# Patient Record
Sex: Male | Born: 1950 | Race: White | Hispanic: No | Marital: Single | Smoking: Never smoker
Health system: Southern US, Community
[De-identification: ages and names within clinical notes are randomized; demographics above are authoritative.]

## PROBLEM LIST (undated history)

## (undated) DIAGNOSIS — I10 Essential (primary) hypertension: Secondary | ICD-10-CM

## (undated) DIAGNOSIS — I4891 Unspecified atrial fibrillation: Secondary | ICD-10-CM

## (undated) DIAGNOSIS — E119 Type 2 diabetes mellitus without complications: Secondary | ICD-10-CM

---

## 2012-03-11 ENCOUNTER — Observation Stay (HOSPITAL_BASED_OUTPATIENT_CLINIC_OR_DEPARTMENT_OTHER)
Admission: EM | Admit: 2012-03-11 | Discharge: 2012-03-12 | Disposition: A | Discharge status: Home / Self Care | Payer: Self-pay | Attending: Internal Medicine | Admitting: Internal Medicine

## 2012-03-11 ENCOUNTER — Encounter (HOSPITAL_BASED_OUTPATIENT_CLINIC_OR_DEPARTMENT_OTHER): Payer: Self-pay | Admitting: Family Medicine

## 2012-03-11 ENCOUNTER — Emergency Department (HOSPITAL_BASED_OUTPATIENT_CLINIC_OR_DEPARTMENT_OTHER): Payer: Self-pay

## 2012-03-11 DIAGNOSIS — I10 Essential (primary) hypertension: Secondary | ICD-10-CM | POA: Diagnosis present

## 2012-03-11 DIAGNOSIS — R110 Nausea: Secondary | ICD-10-CM | POA: Insufficient documentation

## 2012-03-11 DIAGNOSIS — R079 Chest pain, unspecified: Principal | ICD-10-CM | POA: Diagnosis present

## 2012-03-11 DIAGNOSIS — E118 Type 2 diabetes mellitus with unspecified complications: Secondary | ICD-10-CM | POA: Diagnosis present

## 2012-03-11 DIAGNOSIS — IMO0002 Reserved for concepts with insufficient information to code with codable children: Secondary | ICD-10-CM | POA: Diagnosis present

## 2012-03-11 DIAGNOSIS — E785 Hyperlipidemia, unspecified: Secondary | ICD-10-CM | POA: Diagnosis present

## 2012-03-11 DIAGNOSIS — F1010 Alcohol abuse, uncomplicated: Secondary | ICD-10-CM | POA: Diagnosis present

## 2012-03-11 DIAGNOSIS — E1165 Type 2 diabetes mellitus with hyperglycemia: Secondary | ICD-10-CM | POA: Diagnosis present

## 2012-03-11 DIAGNOSIS — R0602 Shortness of breath: Secondary | ICD-10-CM | POA: Insufficient documentation

## 2012-03-11 DIAGNOSIS — R42 Dizziness and giddiness: Secondary | ICD-10-CM | POA: Insufficient documentation

## 2012-03-11 DIAGNOSIS — I4891 Unspecified atrial fibrillation: Secondary | ICD-10-CM | POA: Diagnosis present

## 2012-03-11 HISTORY — DX: Type 2 diabetes mellitus without complications: E11.9

## 2012-03-11 HISTORY — DX: Essential (primary) hypertension: I10

## 2012-03-11 LAB — COMPREHENSIVE METABOLIC PANEL
ALT: 44 U/L (ref 0–53)
CO2: 28 mEq/L (ref 19–32)
Calcium: 10 mg/dL (ref 8.4–10.5)
Creatinine, Ser: 0.8 mg/dL (ref 0.50–1.35)
GFR calc Af Amer: >90 mL/min (ref >90)
GFR calc non Af Amer: >90 mL/min (ref >90)
Glucose, Bld: 236 mg/dL — ABNORMAL HIGH (ref 70–99)

## 2012-03-11 LAB — CBC WITH DIFFERENTIAL/PLATELET
Eosinophils Relative: 1 % (ref 0–5)
HCT: 40.9 % (ref 39.0–52.0)
Lymphocytes Relative: 20 % (ref 12–46)
Lymphs Abs: 1.5 10*3/uL (ref 0.7–4.0)
MCV: 91.9 fL (ref 78.0–100.0)
Monocytes Absolute: 0.7 10*3/uL (ref 0.1–1.0)
Monocytes Relative: 8 % (ref 3–12)
RBC: 4.45 MIL/uL (ref 4.22–5.81)
WBC: 8.9 10*3/uL (ref 4.0–10.5)

## 2012-03-11 LAB — HEPARIN LEVEL (UNFRACTIONATED): Heparin Unfractionated: 0.32 IU/mL (ref 0.30–0.70)

## 2012-03-11 LAB — RAPID URINE DRUG SCREEN, HOSP PERFORMED
Amphetamines: NOT DETECTED
Benzodiazepines: NOT DETECTED
Opiates: NOT DETECTED
Tetrahydrocannabinol: NOT DETECTED

## 2012-03-11 LAB — URINALYSIS, ROUTINE W REFLEX MICROSCOPIC
Hgb urine dipstick: NEGATIVE
Protein, ur: NEGATIVE mg/dL
Urobilinogen, UA: 0.2 mg/dL (ref 0.0–1.0)

## 2012-03-11 LAB — GLUCOSE, CAPILLARY: Glucose-Capillary: 217 mg/dL — ABNORMAL HIGH (ref 70–99)

## 2012-03-11 MED ORDER — LORAZEPAM 1 MG PO TABS: 1.0000 mg | ORAL_TABLET | Freq: Four times a day (QID) | ORAL | Status: DC | PRN

## 2012-03-11 MED ORDER — ONDANSETRON HCL 4 MG/2ML IJ SOLN
INTRAMUSCULAR | Status: AC
  Administered 2012-03-11: 16:00:00
  Filled 2012-03-11: qty 2

## 2012-03-11 MED ORDER — SODIUM CHLORIDE 0.9 % IV SOLN: INTRAVENOUS | Status: DC

## 2012-03-11 MED ORDER — HYDRALAZINE HCL 20 MG/ML IJ SOLN: 10.0000 mg | Freq: Four times a day (QID) | INTRAMUSCULAR | Status: DC | PRN

## 2012-03-11 MED ORDER — ENOXAPARIN SODIUM 40 MG/0.4ML ~~LOC~~ SOLN
40.0000 mg | SUBCUTANEOUS | Status: DC
  Filled 2012-03-11: qty 0.4

## 2012-03-11 MED ORDER — ONDANSETRON HCL 4 MG PO TABS: 4.0000 mg | ORAL_TABLET | Freq: Four times a day (QID) | ORAL | Status: DC | PRN

## 2012-03-11 MED ORDER — NITROGLYCERIN IN D5W 200-5 MCG/ML-% IV SOLN
5.0000 ug/min | INTRAVENOUS | Status: DC
  Administered 2012-03-11: 5 ug/min via INTRAVENOUS
  Filled 2012-03-11: qty 250

## 2012-03-11 MED ORDER — LORAZEPAM 2 MG/ML IJ SOLN: 1.0000 mg | Freq: Four times a day (QID) | INTRAMUSCULAR | Status: DC | PRN

## 2012-03-11 MED ORDER — NITROGLYCERIN 0.4 MG SL SUBL
0.4000 mg | SUBLINGUAL_TABLET | SUBLINGUAL | Status: AC | PRN
  Administered 2012-03-11 (×3): 0.4 mg via SUBLINGUAL
  Filled 2012-03-11: qty 25

## 2012-03-11 MED ORDER — FOLIC ACID 1 MG PO TABS
1.0000 mg | ORAL_TABLET | Freq: Every day | ORAL | Status: DC
  Administered 2012-03-12: 1 mg via ORAL
  Filled 2012-03-11: qty 1

## 2012-03-11 MED ORDER — CARVEDILOL 6.25 MG PO TABS
6.2500 mg | ORAL_TABLET | Freq: Two times a day (BID) | ORAL | Status: DC
  Administered 2012-03-11 – 2012-03-12 (×2): 6.25 mg via ORAL
  Filled 2012-03-11 (×4): qty 1

## 2012-03-11 MED ORDER — THIAMINE HCL 100 MG/ML IJ SOLN
100.0000 mg | Freq: Every day | INTRAMUSCULAR | Status: DC
  Filled 2012-03-11: qty 1

## 2012-03-11 MED ORDER — INSULIN GLARGINE 100 UNIT/ML ~~LOC~~ SOLN
10.0000 [IU] | Freq: Every day | SUBCUTANEOUS | Status: DC
  Administered 2012-03-11: 10 [IU] via SUBCUTANEOUS

## 2012-03-11 MED ORDER — MORPHINE SULFATE 2 MG/ML IJ SOLN
1.0000 mg | INTRAMUSCULAR | Status: DC | PRN
  Administered 2012-03-11: 2 mg via INTRAVENOUS
  Filled 2012-03-11: qty 1

## 2012-03-11 MED ORDER — VITAMIN B-1 100 MG PO TABS
100.0000 mg | ORAL_TABLET | Freq: Every day | ORAL | Status: DC
  Administered 2012-03-12: 100 mg via ORAL
  Filled 2012-03-11: qty 1

## 2012-03-11 MED ORDER — INSULIN ASPART 100 UNIT/ML ~~LOC~~ SOLN
0.0000 [IU] | Freq: Three times a day (TID) | SUBCUTANEOUS | Status: DC
  Administered 2012-03-11 – 2012-03-12 (×2): 2 [IU] via SUBCUTANEOUS

## 2012-03-11 MED ORDER — HEPARIN BOLUS VIA INFUSION
4000.0000 [IU] | Freq: Once | INTRAVENOUS | Status: AC
  Administered 2012-03-11: 4000 [IU] via INTRAVENOUS
  Filled 2012-03-11: qty 4000

## 2012-03-11 MED ORDER — ADULT MULTIVITAMIN W/MINERALS CH
1.0000 | ORAL_TABLET | Freq: Every day | ORAL | Status: DC
  Administered 2012-03-12: 1 via ORAL
  Filled 2012-03-11: qty 1

## 2012-03-11 MED ORDER — LISINOPRIL 5 MG PO TABS
5.0000 mg | ORAL_TABLET | Freq: Every day | ORAL | Status: DC
  Administered 2012-03-11 – 2012-03-12 (×2): 5 mg via ORAL
  Filled 2012-03-11 (×2): qty 1

## 2012-03-11 MED ORDER — HEPARIN (PORCINE) IN NACL 100-0.45 UNIT/ML-% IJ SOLN
1600.0000 [IU]/h | INTRAMUSCULAR | Status: DC
  Administered 2012-03-11: 1400 [IU]/h via INTRAVENOUS
  Administered 2012-03-12: 1600 [IU]/h via INTRAVENOUS
  Filled 2012-03-11 (×3): qty 250

## 2012-03-11 MED ORDER — REGADENOSON 0.4 MG/5ML IV SOLN
0.4000 mg | Freq: Once | INTRAVENOUS | Status: DC
  Filled 2012-03-11: qty 5

## 2012-03-11 MED ORDER — INSULIN ASPART 100 UNIT/ML ~~LOC~~ SOLN: 0.0000 [IU] | Freq: Every day | SUBCUTANEOUS | Status: DC

## 2012-03-11 MED ORDER — ONDANSETRON HCL 4 MG/2ML IJ SOLN: 4.0000 mg | Freq: Four times a day (QID) | INTRAMUSCULAR | Status: DC | PRN

## 2012-03-11 NOTE — Progress Notes (Signed)
Utilization Review Completed.Delois Tolbert T11/27/2013   

## 2012-03-11 NOTE — ED Notes (Signed)
Patient transported to X-ray monitored by this rn.

## 2012-03-11 NOTE — ED Notes (Signed)
Pt c/o chest pain and "fluttering" in chest since yesterday with nausea, dizziness.

## 2012-03-11 NOTE — Progress Notes (Signed)
  Echocardiogram 2D Echocardiogram has been performed.  Georgian Co 03/11/2012, 4:40 PM

## 2012-03-11 NOTE — ED Provider Notes (Addendum)
History     CSN: [id number]  Arrival date & time 03/11/12  4098   First MD Initiated Contact with Patient 03/11/12 [phone]      Chief Complaint  Patient presents with  . Chest Pain    (Consider location/radiation/quality/duration/timing/severity/associated sxs/prior treatment) HPI Comments: Patient complains of chest pain that started yesterday and has been constant however has been waxing and waning since then. He describes it as a tightness to the left chest which is nonradiating. He's had some shortness of breath and nausea associated with it. He's had some intermittent fluttering in his chest with associated dizziness. He denies any swelling in his legs. Denies any cough or chest congestion. He denies any diaphoresis. He currently does not have any primary care physician. He states he's had a history of atrial fibrillation but has not been treated for it. He currently is receiving no ongoing medical care. He has noted in the recent past his blood pressures been elevated and recently that his blood sugars have been elevated into the 300 range. He states that over last 2 weeks he's cut out all carbs in his diet. He does report frequent alcohol use. He denies any history of past heart problems other than the irregular heartbeat. He denies having a stress test in the past.  Patient is a 61 y.o. male presenting with chest pain.  Chest Pain Primary symptoms include shortness of breath, palpitations, nausea and dizziness. Pertinent negatives for primary symptoms include no fever, no fatigue, no cough, no abdominal pain and no vomiting.  The palpitations also occurred with dizziness and shortness of breath.   Dizziness also occurs with nausea. Dizziness does not occur with vomiting, weakness or diaphoresis.  Pertinent negatives for associated symptoms include no diaphoresis, no numbness and no weakness.     Past Medical History  Diagnosis Date  . Hypertension   . Diabetes mellitus without  complication     History reviewed. No pertinent past surgical history.  No family history on file.  History  Substance Use Topics  . Smoking status: Never Smoker   . Smokeless tobacco: Not on file  . Alcohol Use: Yes      Review of Systems  Constitutional: Negative for fever, chills, diaphoresis and fatigue.  HENT: Negative for congestion, rhinorrhea and sneezing.   Eyes: Negative.   Respiratory: Positive for shortness of breath. Negative for cough and chest tightness.   Cardiovascular: Positive for chest pain and palpitations. Negative for leg swelling.  Gastrointestinal: Positive for nausea. Negative for vomiting, abdominal pain, diarrhea and blood in stool.  Genitourinary: Negative for frequency, hematuria, flank pain and difficulty urinating.  Musculoskeletal: Negative for back pain and arthralgias.  Skin: Negative for rash.  Neurological: Positive for dizziness. Negative for speech difficulty, weakness, numbness and headaches.    Allergies  Aspirin and Nsaids  Home Medications  No current outpatient prescriptions on file.  BP 167/132  Pulse 98  Temp 98 F (36.7 C) (Oral)  Resp 18  Ht 6' (1.829 m)  Wt 241 lb (109.317 kg)  BMI 32.69 kg/m2  SpO2 100%  Physical Exam  Constitutional: He is oriented to person, place, and time. He appears well-developed and well-nourished.  HENT:  Head: Normocephalic and atraumatic.  Eyes: Pupils are equal, round, and reactive to light.  Neck: Normal range of motion. Neck supple.       He has some fullness noted to his bilateral supraclavicular area. No definitive masses palpated.  Cardiovascular: Normal rate and normal heart sounds.  An irregularly irregular rhythm present.  Pulmonary/Chest: Effort normal and breath sounds normal. No respiratory distress. He has no wheezes. He has no rales. He exhibits no tenderness.  Abdominal: Soft. Bowel sounds are normal. There is no tenderness. There is no rebound and no guarding.    Musculoskeletal: Normal range of motion. He exhibits no edema.  Lymphadenopathy:    He has no cervical adenopathy.  Neurological: He is alert and oriented to person, place, and time.  Skin: Skin is warm and dry. No rash noted.  Psychiatric: He has a normal mood and affect.    ED Course  Procedures (including critical care time)  Results for orders placed during the hospital encounter of 03/11/12  CBC WITH DIFFERENTIAL      Component Value Range   WBC 8.9  4.0 - 10.5 K/uL   RBC 4.45  4.22 - 5.81 MIL/uL   Hemoglobin 15.0  13.0 - 17.0 g/dL   HCT 16.1  09.6 - 04.5 %   MCV 91.9  78.0 - 100.0 fL   MCH 33.7  26.0 - 34.0 pg   MCHC 36.7 (*) 30.0 - 36.0 g/dL   RDW 40.9  81.1 - 91.4 %   Platelets 151  150 - 400 K/uL   Neutrophils Relative 71  43 - 77 %   Neutro Abs 5.5  1.7 - 7.7 K/uL   Lymphocytes Relative 20  12 - 46 %   Lymphs Abs 1.5  0.7 - 4.0 K/uL   Monocytes Relative 8  3 - 12 %   Monocytes Absolute 0.7  0.1 - 1.0 K/uL   Eosinophils Relative 1  0 - 5 %   Eosinophils Absolute 0.1  0.0 - 0.7 K/uL   Basophils Relative 0  0 - 1 %   Basophils Absolute 0.0  0.0 - 0.1 K/uL  COMPREHENSIVE METABOLIC PANEL      Component Value Range   Sodium 136  135 - 145 mEq/L   Potassium 4.1  3.5 - 5.1 mEq/L   Chloride 95 (*) 96 - 112 mEq/L   CO2 28  19 - 32 mEq/L   Glucose, Bld 236 (*) 70 - 99 mg/dL   BUN 13  6 - 23 mg/dL   Creatinine, Ser 7.82  0.50 - 1.35 mg/dL   Calcium 95.6  8.4 - 21.3 mg/dL   Total Protein 8.2  6.0 - 8.3 g/dL   Albumin 4.7  3.5 - 5.2 g/dL   AST 38 (*) 0 - 37 U/L   ALT 44  0 - 53 U/L   Alkaline Phosphatase 71  39 - 117 U/L   Total Bilirubin 0.7  0.3 - 1.2 mg/dL   GFR calc non Af Amer >90  >90 mL/min   GFR calc Af Amer >90  >90 mL/min  TROPONIN I      Component Value Range   Troponin I <0.30  <0.30 ng/mL  GLUCOSE, CAPILLARY      Component Value Range   Glucose-Capillary 217 (*) 70 - 99 mg/dL  ETHANOL      Component Value Range   Alcohol, Ethyl (B) <11  0 - 11  mg/dL   Dg Chest 2 View  08/65/7846  *RADIOLOGY REPORT*  Clinical Data: Chest pain.  Flaring of the chest.  Nausea and dizziness.  History of diabetes and hypertension.  CHEST - 2 VIEW  Comparison: None.  Findings:  Cardiopericardial silhouette within normal limits. Mediastinal contours normal. Trachea midline.  No airspace disease or effusion. Monitoring leads are  projected over the chest.  Mild wedging of lower thoracic vertebral bodies is probably chronic.  IMPRESSION: No active cardiopulmonary disease.   Original Report Authenticated By: Andreas Newport, M.D.     Date: 03/11/2012  Rate: 96  Rhythm: atrial fibrillation  QRS Axis: normal  Intervals: normal  ST/T Wave abnormalities: nonspecific ST/T changes  Conduction Disutrbances:none  Narrative Interpretation:   Old EKG Reviewed: none available     1. Chest pain       MDM  Pt virtually pain free after nitro, still mild discomfort.  With his uncontrolled HTN, DM, a-fib, I feel that he needs to be admitted for further evaluation.  Will consult hospitalist at [facility].  Pt was not given asa as he says that he has anaphylaxis to that.        Rolan Bucco, MD 03/11/12 1134  Rolan Bucco, MD 03/11/12 1157

## 2012-03-11 NOTE — Consult Note (Signed)
Reason for Consult: chest pain Referring Physician:   Carolos Fecher Hayse is an 61 y.o. male.  HPI:   The patient is a 61 year old Caucasian male with history of chronic atrial fibrillation, hypertension, diabetes mellitus. He presented to [facility] after developing chest pain at 1900 hours yesterday evening. He states it was very sharp in nature, left-sided, in rated about at 8-9/10 in intensity. He reports no radiation to his neck back jaw or arms.  He does report nausea, vomiting, diaphoresis, shortness of breath, palpitations.  He states that at 1900 hours yesterday his blood pressure was 227/139. He waited a couple hours rechecked it is down to the 180s. He then went to bed and woke up with severe chest pain continuing. At that point he decided to come to the emergency room.  He states that at no time last week he have any chest pain or tightness with exertion.  He also denies cough, congestion, fever, orthopnea, PND, abdominal pain, dysuria, hematuria, hematochezia, melena.  Initial troponin was negative. EKG shows atrial fibrillation with controlled ventricular rate no acute changes.  Past Medical History  Diagnosis Date  . Hypertension   . Diabetes mellitus without complication     History reviewed. No pertinent past surgical history.  Family History  Problem Relation Age of Onset  . Cancer - Other Mother   . Pancreatic cancer Father   . Congestive Heart Failure Father     Social History:  reports that he has never smoked. He does not have any smokeless tobacco history on file. He reports that he drinks alcohol. He reports that he does not use illicit drugs.  Allergies:  Allergies  Allergen Reactions  . Aspirin Anaphylaxis  . Nsaids Anaphylaxis  . Rhubarb   . Strawberry     Medications:     . carvedilol  6.25 mg Oral BID WC  . enoxaparin (LOVENOX) injection  40 mg Subcutaneous Q24H  . insulin aspart  0-5 Units Subcutaneous QHS  . insulin aspart  0-9 Units  Subcutaneous TID WC  . insulin glargine  10 Units Subcutaneous QHS  . lisinopril  5 mg Oral Daily  . [COMPLETED] ondansetron         Results for orders placed during the hospital encounter of 03/11/12 (from the past 48 hour(s))  CBC WITH DIFFERENTIAL     Status: Abnormal   Collection Time   03/11/12 10:00 AM      Component Value Range Comment   WBC 8.9  4.0 - 10.5 K/uL    RBC 4.45  4.22 - 5.81 MIL/uL    Hemoglobin 15.0  13.0 - 17.0 g/dL    HCT 45.4  09.8 - 11.9 %    MCV 91.9  78.0 - 100.0 fL    MCH 33.7  26.0 - 34.0 pg    MCHC 36.7 (*) 30.0 - 36.0 g/dL CORRECTED FOR GLUCOSE INTERFERENCE   RDW 12.2  11.5 - 15.5 %    Platelets 151  150 - 400 K/uL    Neutrophils Relative 71  43 - 77 %    Neutro Abs 5.5  1.7 - 7.7 K/uL    Lymphocytes Relative 20  12 - 46 %    Lymphs Abs 1.5  0.7 - 4.0 K/uL    Monocytes Relative 8  3 - 12 %    Monocytes Absolute 0.7  0.1 - 1.0 K/uL    Eosinophils Relative 1  0 - 5 %    Eosinophils Absolute 0.1  0.0 -  0.7 K/uL    Basophils Relative 0  0 - 1 %    Basophils Absolute 0.0  0.0 - 0.1 K/uL   COMPREHENSIVE METABOLIC PANEL     Status: Abnormal   Collection Time   03/11/12 10:00 AM      Component Value Range Comment   Sodium 136  135 - 145 mEq/L    Potassium 4.1  3.5 - 5.1 mEq/L    Chloride 95 (*) 96 - 112 mEq/L    CO2 28  19 - 32 mEq/L    Glucose, Bld 236 (*) 70 - 99 mg/dL    BUN 13  6 - 23 mg/dL    Creatinine, Ser 2.95  0.50 - 1.35 mg/dL    Calcium 62.1  8.4 - 10.5 mg/dL    Total Protein 8.2  6.0 - 8.3 g/dL    Albumin 4.7  3.5 - 5.2 g/dL    AST 38 (*) 0 - 37 U/L    ALT 44  0 - 53 U/L    Alkaline Phosphatase 71  39 - 117 U/L    Total Bilirubin 0.7  0.3 - 1.2 mg/dL    GFR calc non Af Amer >90  >90 mL/min    GFR calc Af Amer >90  >90 mL/min   TROPONIN I     Status: Normal   Collection Time   03/11/12 10:00 AM      Component Value Range Comment   Troponin I <0.30  <0.30 ng/mL   ETHANOL     Status: Normal   Collection Time   03/11/12 10:00 AM        Component Value Range Comment   Alcohol, Ethyl (B) <11  0 - 11 mg/dL   HEMOGLOBIN H0Q     Status: Abnormal   Collection Time   03/11/12 10:03 AM      Component Value Range Comment   Hemoglobin A1C 9.3 (*) <5.7 %    Mean Plasma Glucose 220 (*) <117 mg/dL   GLUCOSE, CAPILLARY     Status: Abnormal   Collection Time   03/11/12 10:11 AM      Component Value Range Comment   Glucose-Capillary 217 (*) 70 - 99 mg/dL   URINALYSIS, ROUTINE W REFLEX MICROSCOPIC     Status: Abnormal   Collection Time   03/11/12  1:01 PM      Component Value Range Comment   Color, Urine YELLOW  YELLOW    APPearance CLEAR  CLEAR    Specific Gravity, Urine 1.017  1.005 - 1.030    pH 6.0  5.0 - 8.0    Glucose, UA 250 (*) NEGATIVE mg/dL    Hgb urine dipstick NEGATIVE  NEGATIVE    Bilirubin Urine NEGATIVE  NEGATIVE    Ketones, ur 40 (*) NEGATIVE mg/dL    Protein, ur NEGATIVE  NEGATIVE mg/dL    Urobilinogen, UA 0.2  0.0 - 1.0 mg/dL    Nitrite NEGATIVE  NEGATIVE    Leukocytes, UA NEGATIVE  NEGATIVE MICROSCOPIC NOT DONE ON URINES WITH NEGATIVE PROTEIN, BLOOD, LEUKOCYTES, NITRITE, OR GLUCOSE <1000 mg/dL.  URINE RAPID DRUG SCREEN (HOSP PERFORMED)     Status: Normal   Collection Time   03/11/12  1:01 PM      Component Value Range Comment   Opiates NONE DETECTED  NONE DETECTED    Cocaine NONE DETECTED  NONE DETECTED    Benzodiazepines NONE DETECTED  NONE DETECTED    Amphetamines NONE DETECTED  NONE DETECTED  Tetrahydrocannabinol NONE DETECTED  NONE DETECTED    Barbiturates NONE DETECTED  NONE DETECTED     Dg Chest 2 View  03/11/2012  *RADIOLOGY REPORT*  Clinical Data: Chest pain.  Flaring of the chest.  Nausea and dizziness.  History of diabetes and hypertension.  CHEST - 2 VIEW  Comparison: None.  Findings:  Cardiopericardial silhouette within normal limits. Mediastinal contours normal. Trachea midline.  No airspace disease or effusion. Monitoring leads are projected over the chest.  Mild wedging of lower  thoracic vertebral bodies is probably chronic.  IMPRESSION: No active cardiopulmonary disease.   Original Report Authenticated By: Andreas Newport, M.D.     Review of Systems  Constitutional: Positive for diaphoresis. Negative for fever.  HENT: Negative for congestion and sore throat.   Respiratory: Positive for shortness of breath. Negative for cough and wheezing.   Cardiovascular: Positive for chest pain and palpitations. Negative for orthopnea, claudication, leg swelling and PND.  Gastrointestinal: Positive for nausea and vomiting. Negative for abdominal pain, diarrhea, constipation, blood in stool and melena.  Genitourinary: Negative for dysuria and hematuria.  Musculoskeletal: Negative for myalgias.  Neurological: Positive for dizziness.   Blood pressure 167/93, pulse 88, temperature 98.5 F (36.9 C), temperature source Oral, resp. rate 19, height 6' (1.829 m), weight 109.317 kg (241 lb), SpO2 96.00%. Physical Exam  Constitutional: He is oriented to person, place, and time. He appears well-developed and well-nourished.  HENT:  Head: Normocephalic and atraumatic.  Eyes: EOM are normal. Pupils are equal, round, and reactive to light. No scleral icterus.  Neck: Normal range of motion. Neck supple.  Cardiovascular: Normal rate.  An irregularly irregular rhythm present.  No murmur heard. Pulses:      Radial pulses are 2+ on the right side, and 2+ on the left side.       Dorsalis pedis pulses are 2+ on the right side, and 2+ on the left side.       No carotid bruits  Respiratory: Effort normal and breath sounds normal. He has no wheezes. He has no rales.  GI: Soft. Bowel sounds are normal. He exhibits no distension. There is no tenderness.  Musculoskeletal: He exhibits no edema.  Lymphadenopathy:    He has no cervical adenopathy.  Neurological: He is alert and oriented to person, place, and time. He exhibits normal muscle tone.  Skin: Skin is warm. He is diaphoretic.  Psychiatric: He  has a normal mood and affect.    Assessment/Plan: Patient Active Hospital Problem List: Chest pain (03/11/2012) DM (diabetes mellitus), type 2, uncontrolled with complications (03/11/2012) Uncontrolled hypertension (03/11/2012) A-fib (03/11/2012)  Plan:  We will continue to cycle Troponin.  Echo is pending.  Will add IV heparin.  Will schedule Lexiscan NST for tomorrow.   HAGER, BRYAN 03/11/2012, 4:00 PM    Patient seen and examined. Agree with assessment and plan. 61 yo WM who admits to a 20 yr history of an irregular heart rhythm.  He has not been on anticoagulation.  Apparently he has been self monitoring his blood sugar and was told of being diabetic within the past year. He presents today after having accelerated hypertension with BP yesterday [id number] at home. Today he developed sharp chest discomfort leading to hospitalization. Echo is currently being done. Ecg reveals AF without acute STT changes. Agree with beta-blocker and ACE-I. Will start anticoagulation since BP improved and initiate ischemic workup with lexiscan myoview study tomorrow.   Lennette Bihari, MD, [facility] 03/11/2012 4:11 PM

## 2012-03-11 NOTE — Progress Notes (Signed)
ANTICOAGULATION CONSULT NOTE - Follow Up Consult  Pharmacy Consult for Heparin Indication: chest pain/ACS  Allergies  Allergen Reactions  . Aspirin Anaphylaxis  . Nsaids Anaphylaxis  . Rhubarb   . Strawberry     Patient Measurements: Height: 6' (182.9 cm) Weight: 241 lb (109.317 kg) IBW/kg (Calculated) : 77.6  Heparin Dosing Weight: 101 kg  Vital Signs: Temp: 98.2 F (36.8 C) (11/27 2100) Temp src: Oral (11/27 2100) BP: 143/70 mmHg (11/27 2300) Pulse Rate: 88  (11/27 1530)  Labs:  Basename 03/11/12 2240 03/11/12 1820 03/11/12 1000  HGB -- -- 15.0  HCT -- -- 40.9  PLT -- -- 151  APTT -- -- --  LABPROT -- -- --  INR -- -- --  HEPARINUNFRC 0.32 -- --  CREATININE -- -- 0.80  CKTOTAL -- -- --  CKMB -- -- --  TROPONINI <0.30 <0.30 <0.30    Estimated Creatinine Clearance: 125.4 ml/min (by C-G formula based on Cr of 0.8).   Medical History: Past Medical History  Diagnosis Date  . Hypertension   . Diabetes mellitus without complication     Medications:  Prescriptions prior to admission  Medication Sig Dispense Refill  . Homeopathic Products (BERBERIS HOMACCORD PO) Take 4 capsules by mouth daily. Blood sugar        Assessment: 61 y.o. male presents with chest pain on IV heparin for r/o ACS. Noted for [employer] [facility] tomorrow.  Heparin level (0.32) is at low-end of goal range on 1400 units/hr.   Goal of Therapy:  Heparin level 0.3-0.7 units/ml Monitor platelets by anticoagulation protocol: Yes   Plan:  1. Increase IV heparin to 1600 units/hr to keep at-goal.  2. Daily CBC, heparin level.   Lorre Munroe, PharmD 03/11/2012,11:39 PM

## 2012-03-11 NOTE — Progress Notes (Signed)
ANTICOAGULATION CONSULT NOTE - Initial Consult  Pharmacy Consult for Heparin Indication: chest pain/ACS  Allergies  Allergen Reactions  . Aspirin Anaphylaxis  . Nsaids Anaphylaxis  . Rhubarb   . Strawberry     Patient Measurements: Height: 6' (182.9 cm) Weight: 241 lb (109.317 kg) IBW/kg (Calculated) : 77.6  Heparin Dosing Weight: 101 kg  Vital Signs: Temp: 98.5 F (36.9 C) (11/27 1530) Temp src: Oral (11/27 1530) BP: 167/93 mmHg (11/27 1530) Pulse Rate: 88  (11/27 1530)  Labs:  Basename 03/11/12 1000  HGB 15.0  HCT 40.9  PLT 151  APTT --  LABPROT --  INR --  HEPARINUNFRC --  CREATININE 0.80  CKTOTAL --  CKMB --  TROPONINI <0.30    Estimated Creatinine Clearance: 125.4 ml/min (by C-G formula based on Cr of 0.8).   Medical History: Past Medical History  Diagnosis Date  . Hypertension   . Diabetes mellitus without complication     Medications:  Prescriptions prior to admission  Medication Sig Dispense Refill  . Homeopathic Products (BERBERIS HOMACCORD PO) Take 4 capsules by mouth daily. Blood sugar        Assessment: 61 y.o. male presents with chest pain. Plan to start IV heparin for r/o ACS. Noted for [employer] [facility] tomorrow.  Goal of Therapy:  Heparin level 0.3-0.7 units/ml Monitor platelets by anticoagulation protocol: Yes   Plan:  1. Heparin bolus 4000 units IV 2. Heparin gtt at 1400 units/hr 3. Heparin level in 6 hours 4. Daily heparin level and CBC 5. D/C Lovenox  Christoper Fabian, PharmD, BCPS Clinical pharmacist, pager [phone] 03/11/2012,4:13 PM

## 2012-03-11 NOTE — H&P (Signed)
Patient's PCP: No primary provider on file.  Chief Complaint: Chest pain  History of Present Illness: Luke Garcia is a 61 y.o. Caucasian male history of hypertension, diabetes, and chronic A. fib not on anticoagulation who presents with the above complaints.  Patient reports that he has had diagnoses of A. fib more than 20 years ago.  He reports managing his diabetes and hypertension with herbal supplements.  He has not seen physician for more than few years.  Yesterday around 7 p.m. he developed sharp left-sided chest pain about 8 or 9/10 in intensity.  He denies any neck or jaw pain.  He did have nausea, vomiting, diaphoresis, shortness of breath and palpitations.  Yesterday when he checked his blood pressure he was 227/129 on recheck it was 189/111.  She woke up this morning with persistent symptoms as a result presented to the emergency department for further evaluation.  He denies any recent fever, does complain of chills especially yesterday.  Denies any abdominal pain or diarrhea.  Did have some vision changes yesterday, which resolved today.  Denies any headaches.  Past Medical History  Diagnosis Date  . Hypertension   . Diabetes mellitus without complication    History reviewed. No pertinent past surgical history. Family History  Problem Relation Age of Onset  . Cancer - Other Mother   . Pancreatic cancer Father   . Congestive Heart Failure Father    History   Social History  . Marital Status: Single    Spouse Name: N/A    Number of Children: N/A  . Years of Education: N/A   Occupational History  . Not on file.   Social History Main Topics  . Smoking status: Never Smoker   . Smokeless tobacco: Not on file  . Alcohol Use: Yes     Comment: Beer and vodka  . Drug Use: No  . Sexually Active: No   Other Topics Concern  . Not on file   Social History Narrative  . No narrative on file   Allergies: Aspirin; Nsaids; Rhubarb; and Strawberry  Home Meds: Prior to  Admission medications   Medication Sig Start Date End Date Taking? Authorizing Provider  Homeopathic Products (BERBERIS HOMACCORD PO) Take 4 capsules by mouth daily. Blood sugar   Yes Historical Provider, MD    Review of Systems: All systems reviewed with the patient and positive as per history of present illness, otherwise all other systems are negative.  Physical Exam: Blood pressure 175/92, pulse 85, temperature 98.5 F (36.9 C), temperature source Oral, resp. rate 18, height 6' (1.829 m), weight 109.317 kg (241 lb), SpO2 97.00%. General: Awake, Oriented x3, No acute distress, patient slightly diaphoretic. HEENT: EOMI, Moist mucous membranes Neck: Supple CV: S1 and S2 Lungs: Clear to ascultation bilaterally Abdomen: Soft, Nontender, Nondistended, +bowel sounds. Ext: Good pulses. Trace edema. No clubbing or cyanosis noted. Neuro: Cranial Nerves II-XII grossly intact. Has 5/5 motor strength in upper and lower extremities.  Lab results:  Basename 03/11/12 1000  NA 136  K 4.1  CL 95*  CO2 28  GLUCOSE 236*  BUN 13  CREATININE 0.80  CALCIUM 10.0  MG --  PHOS --    Basename 03/11/12 1000  AST 38*  ALT 44  ALKPHOS 71  BILITOT 0.7  PROT 8.2  ALBUMIN 4.7   No results found for this basename: LIPASE:2,AMYLASE:2 in the last 72 hours  Basename 03/11/12 1000  WBC 8.9  NEUTROABS 5.5  HGB 15.0  HCT 40.9  MCV 91.9  PLT 151    Basename 03/11/12 1000  CKTOTAL --  CKMB --  CKMBINDEX --  TROPONINI <0.30   No components found with this basename: POCBNP:3 No results found for this basename: DDIMER in the last 72 hours  Basename 03/11/12 1003  HGBA1C 9.3*   No results found for this basename: CHOL:2,HDL:2,LDLCALC:2,TRIG:2,CHOLHDL:2,LDLDIRECT:2 in the last 72 hours No results found for this basename: TSH,T4TOTAL,FREET3,T3FREE,THYROIDAB in the last 72 hours No results found for this basename: VITAMINB12:2,FOLATE:2,FERRITIN:2,TIBC:2,IRON:2,RETICCTPCT:2 in the last 72  hours Imaging results:  Dg Chest 2 View  03/11/2012  *RADIOLOGY REPORT*  Clinical Data: Chest pain.  Flaring of the chest.  Nausea and dizziness.  History of diabetes and hypertension.  CHEST - 2 VIEW  Comparison: None.  Findings:  Cardiopericardial silhouette within normal limits. Mediastinal contours normal. Trachea midline.  No airspace disease or effusion. Monitoring leads are projected over the chest.  Mild wedging of lower thoracic vertebral bodies is probably chronic.  IMPRESSION: No active cardiopulmonary disease.   Original Report Authenticated By: Andreas Newport, M.D.    Other results: EKG: Atrial fibrillation with a rate of 96.  Assessment & Plan by Problem: Chest pain Etiology unclear, still persistent.  Given history of uncontrolled hypertension, diabetes and A. fib will admit the patient to step down for closer monitoring.  Rule the patient out for acute coronary syndrome by cycling cardiac enzymes.  Cardiology consulted, appreciate their input.  Will obtain 2-D echocardiogram.  Cardiology started the patient on IV heparin.  Patient has an allergy to aspirin.  Check lipid panel in the morning.  A. Fib Rate controlled.  Started on IV heparin, transition to oral anticoagulation depending on patient's clinical course.  Uncontrolled hypertension Extensive discussion with the patient as I explained to the patient that oral supplements may no longer control his blood pressure.  Patient understands, start antihypertensive medications, carvedilol and lisinopril.  When necessary hydralazine for systolic blood pressure greater than 160.  Type 2 diabetes uncontrolled with complications Hemoglobin A1c 9.3 on 03/11/2012 suggesting an average blood sugar 220.  Start Lantus and sliding scale insulin.  We'll request dietary consultation to educate the patient.  Alcohol use Continue to monitor.  Start patient on CIWA protocol. Start thiamine and folic acid.  Counseled on cessation.  No primary  care physician Likely will need a primary care physician at discharge to monitor his medical conditions.  Will request social work consultation.  Prophylaxis IV heparin.  CODE STATUS Full code.  This was discussed with the patient at the time of admission.  Disposition Admit the patient as inpatient to step down for monitoring given persistent chest pain.  Time spent on admission, talking to the patient, and coordinating care was: 60 mins.  Nylene Inlow A, MD 03/11/2012, 3:21 PM

## 2012-03-12 DIAGNOSIS — E785 Hyperlipidemia, unspecified: Secondary | ICD-10-CM

## 2012-03-12 LAB — LIPID PANEL
HDL: 48 mg/dL (ref >39)
LDL Cholesterol: 134 mg/dL — ABNORMAL HIGH (ref 0–99)
Total CHOL/HDL Ratio: 4.2 RATIO
Triglycerides: 102 mg/dL (ref <150)

## 2012-03-12 LAB — BASIC METABOLIC PANEL
BUN: 11 mg/dL (ref 6–23)
Chloride: 97 mEq/L (ref 96–112)
GFR calc Af Amer: >90 mL/min (ref >90)
Potassium: 3.9 mEq/L (ref 3.5–5.1)
Sodium: 137 mEq/L (ref 135–145)

## 2012-03-12 LAB — PROTIME-INR
INR: 1.09 (ref 0.00–1.49)
Prothrombin Time: 14 seconds (ref 11.6–15.2)

## 2012-03-12 LAB — CBC
HCT: 38.6 % — ABNORMAL LOW (ref 39.0–52.0)
Platelets: 129 10*3/uL — ABNORMAL LOW (ref 150–400)
RDW: 12.2 % (ref 11.5–15.5)
WBC: 8.9 10*3/uL (ref 4.0–10.5)

## 2012-03-12 LAB — GLUCOSE, CAPILLARY: Glucose-Capillary: 180 mg/dL — ABNORMAL HIGH (ref 70–99)

## 2012-03-12 LAB — HEPARIN LEVEL (UNFRACTIONATED): Heparin Unfractionated: 0.41 IU/mL (ref 0.30–0.70)

## 2012-03-12 MED ORDER — CARVEDILOL 12.5 MG PO TABS: 12.5000 mg | ORAL_TABLET | Freq: Two times a day (BID) | ORAL | Status: AC

## 2012-03-12 MED ORDER — FOLIC ACID 1 MG PO TABS: 1.0000 mg | ORAL_TABLET | Freq: Every day | ORAL | Status: AC

## 2012-03-12 MED ORDER — METFORMIN HCL 500 MG PO TABS
500.0000 mg | ORAL_TABLET | Freq: Two times a day (BID) | ORAL | Status: DC
  Filled 2012-03-12 (×2): qty 1

## 2012-03-12 MED ORDER — ADULT MULTIVITAMIN W/MINERALS CH: 1.0000 | ORAL_TABLET | Freq: Every day | ORAL | Status: AC

## 2012-03-12 MED ORDER — THIAMINE HCL 100 MG PO TABS: 100.0000 mg | ORAL_TABLET | Freq: Every day | ORAL | Status: AC

## 2012-03-12 MED ORDER — CARVEDILOL 12.5 MG PO TABS
12.5000 mg | ORAL_TABLET | Freq: Two times a day (BID) | ORAL | Status: DC
  Filled 2012-03-12 (×2): qty 1

## 2012-03-12 MED ORDER — GLIPIZIDE 2.5 MG HALF TABLET: 2.5000 mg | ORAL_TABLET | Freq: Two times a day (BID) | ORAL | Status: AC

## 2012-03-12 MED ORDER — METFORMIN HCL 500 MG PO TABS: 500.0000 mg | ORAL_TABLET | Freq: Two times a day (BID) | ORAL | Status: AC

## 2012-03-12 MED ORDER — LISINOPRIL 5 MG PO TABS: 5.0000 mg | ORAL_TABLET | Freq: Every day | ORAL | Status: AC

## 2012-03-12 MED ORDER — LOVASTATIN 20 MG PO TABS: 20.0000 mg | ORAL_TABLET | Freq: Every day | ORAL | Status: AC

## 2012-03-12 MED ORDER — GLIPIZIDE 2.5 MG HALF TABLET
2.5000 mg | ORAL_TABLET | Freq: Two times a day (BID) | ORAL | Status: DC
  Filled 2012-03-12 (×3): qty 1

## 2012-03-12 NOTE — Progress Notes (Signed)
Subjective:  No chest pain  Objective:  Vital Signs in the last 24 hours: Temp:  [97.4 F (36.3 C)-98.5 F (36.9 C)] 97.5 F (36.4 C) (11/28 0745) Pulse Rate:  [76-110] 76  (11/28 0745) Resp:  [18-19] 18  (11/28 0745) BP: (126-206)/(66-139) 143/66 mmHg (11/28 0745) SpO2:  [96 %-100 %] 98 % (11/28 0745) Weight:  [109.317 kg (241 lb)-109.4 kg (241 lb 2.9 oz)] 109.4 kg (241 lb 2.9 oz) (11/28 0458)  Intake/Output from previous day:  Intake/Output Summary (Last 24 hours) at 03/12/12 0910 Last data filed at 03/12/12 0800  Gross per 24 hour  Intake    731 ml  Output    850 ml  Net   -119 ml    Physical Exam: General appearance: alert, cooperative and no distress Lungs: clear to auscultation bilaterally Heart: irregularly irregular rhythm   Rate: 80  Rhythm: atrial fibrillation  Lab Results:  Basename 03/12/12 0435 03/11/12 1000  WBC 8.9 8.9  HGB 13.4 15.0  PLT 129* 151    Basename 03/12/12 0435 03/11/12 1000  NA 137 136  K 3.9 4.1  CL 97 95*  CO2 32 28  GLUCOSE 196* 236*  BUN 11 13  CREATININE 0.84 0.80    Basename 03/12/12 0435 03/11/12 2240  TROPONINI <0.30 <0.30   Hepatic Function Panel  Basename 03/11/12 1000  PROT 8.2  ALBUMIN 4.7  AST 38*  ALT 44  ALKPHOS 71  BILITOT 0.7  BILIDIR --  IBILI --    Basename 03/12/12 0435  CHOL 202*    Basename 03/12/12 0435  INR 1.09    Imaging: Imaging results have been reviewed  Cardiac Studies:  Assessment/Plan:   Principal Problem:  *Chest pain, Troponin negative X 3 Active Problems:  Uncontrolled hypertension  A-fib, unknown duration  DM (diabetes mellitus), type 2, uncontrolled with complications  Alcohol abuse, daily use  Dyslipidemia   Plan- The pt feels he can afford 4 dollar prescriptions. He does not want to take Coumadin but would consider an alternative if it could be provided for him, (he may not be a candidate for anticoagulation secondary to "daily ETOH").  Myoview will not be  done today and could be arranged as an OP. Add Lovastatin 20mg  at discharge. We will arrange early follow up.   Corine Shelter PA-C 03/12/2012, 9:10 AM   I have seen and examined the patient along with Corine Shelter PA-C.  I have reviewed the chart, notes and new data.  I agree with PA/NP's note.  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 Angina may be due to CAD, but no high risk features for ACS (negative enzymes and ECG).  Compliance with meds is an issue, both due to financial issues and lack of understanding of medical issues and preference for "natural" remedies.  Will try to find a regimen of inexpensive anti hypertensives. Outpt. nuclear perfusion study. ASA 162 mg daily for CVA prevention since neither warfarin nor newer anticoagulants are safe options in his case.    Thurmon Fair, MD, [facility] [phone] 03/12/2012, 9:27 AM

## 2012-03-12 NOTE — Progress Notes (Signed)
Subjective: Had intermittent episodes of chest pain during the night. Currently denies any chest pain.  Objective: Vital signs in last 24 hours: Filed Vitals:   03/12/12 0300 03/12/12 0400 03/12/12 0458 03/12/12 0745  BP: 139/77   143/66  Pulse:    76  Temp:  97.4 F (36.3 C)  97.5 F (36.4 C)  TempSrc:  Oral  Oral  Resp:    18  Height:      Weight:   109.4 kg (241 lb 2.9 oz)   SpO2: 97% 98%  98%   Weight change:   Intake/Output Summary (Last 24 hours) at 03/12/12 0930 Last data filed at 03/12/12 0800  Gross per 24 hour  Intake    731 ml  Output    850 ml  Net   -119 ml    Physical Exam: General: Awake, Oriented, No acute distress. HEENT: EOMI. Neck: Supple CV: S1 and S2 Lungs: Clear to ascultation bilaterally Abdomen: Soft, Nontender, Nondistended, +bowel sounds. Ext: Good pulses. Trace edema.  Lab Results: Basic Metabolic Panel:  Lab 03/12/12 4540 03/11/12 1000  NA 137 136  K 3.9 4.1  CL 97 95*  CO2 32 28  GLUCOSE 196* 236*  BUN 11 13  CREATININE 0.84 0.80  CALCIUM 9.0 10.0  MG -- --  PHOS -- --   Liver Function Tests:  Lab 03/11/12 1000  AST 38*  ALT 44  ALKPHOS 71  BILITOT 0.7  PROT 8.2  ALBUMIN 4.7   No results found for this basename: LIPASE:5,AMYLASE:5 in the last 168 hours No results found for this basename: AMMONIA:5 in the last 168 hours CBC:  Lab 03/12/12 0435 03/11/12 1000  WBC 8.9 8.9  NEUTROABS -- 5.5  HGB 13.4 15.0  HCT 38.6* 40.9  MCV 92.8 91.9  PLT 129* 151   Cardiac Enzymes:  Lab 03/12/12 0435 03/11/12 2240 03/11/12 1820 03/11/12 1000  CKTOTAL -- -- -- --  CKMB -- -- -- --  CKMBINDEX -- -- -- --  TROPONINI <0.30 <0.30 <0.30 <0.30   BNP (last 3 results) No results found for this basename: PROBNP:3 in the last 8760 hours CBG:  Lab 03/12/12 0755 03/11/12 2048 03/11/12 1636 03/11/12 1011  GLUCAP 180* 200* 192* 217*    Basename 03/11/12 1003  HGBA1C 9.3*   Other Labs: No components found with this basename:  POCBNP:3 No results found for this basename: DDIMER:2 in the last 168 hours  Lab 03/12/12 0435  CHOL 202*  HDL 48  LDLCALC 134*  TRIG 102  CHOLHDL 4.2  LDLDIRECT --   No results found for this basename: TSH,T4TOTAL,FREET3,T3FREE,FREET4,THYROIDAB in the last 168 hours No results found for this basename: VITAMINB12:2,FOLATE:2,FERRITIN:2,TIBC:2,IRON:2,RETICCTPCT:2 in the last 168 hours  Micro Results: Recent Results (from the past 240 hour(s))  MRSA PCR SCREENING     Status: Normal   Collection Time   03/11/12  2:31 PM      Component Value Range Status Comment   MRSA by PCR NEGATIVE  NEGATIVE Final     Studies/Results: Dg Chest 2 View  03/11/2012  *RADIOLOGY REPORT*  Clinical Data: Chest pain.  Flaring of the chest.  Nausea and dizziness.  History of diabetes and hypertension.  CHEST - 2 VIEW  Comparison: None.  Findings:  Cardiopericardial silhouette within normal limits. Mediastinal contours normal. Trachea midline.  No airspace disease or effusion. Monitoring leads are projected over the chest.  Mild wedging of lower thoracic vertebral bodies is probably chronic.  IMPRESSION: No active cardiopulmonary disease.  Original Report Authenticated By: Andreas Newport, M.D.     Medications: I have reviewed the patient's current medications. Scheduled Meds:   . carvedilol  6.25 mg Oral BID WC  . folic acid  1 mg Oral Daily  . glipiZIDE  2.5 mg Oral BID AC  . [COMPLETED] heparin  4,000 Units Intravenous Once  . insulin aspart  0-5 Units Subcutaneous QHS  . insulin aspart  0-9 Units Subcutaneous TID WC  . insulin glargine  10 Units Subcutaneous QHS  . lisinopril  5 mg Oral Daily  . multivitamin with minerals  1 tablet Oral Daily  . [COMPLETED] ondansetron      . thiamine  100 mg Oral Daily   Or  . thiamine  100 mg Intravenous Daily  . [DISCONTINUED] enoxaparin (LOVENOX) injection  40 mg Subcutaneous Q24H  . [DISCONTINUED] regadenoson  0.4 mg Intravenous Once   Continuous  Infusions:   . sodium chloride 10 mL/hr at 03/11/12 1545  . heparin 1,600 Units/hr (03/12/12 0559)  . nitroGLYCERIN 10 mcg/min (03/11/12 1400)  . [DISCONTINUED] sodium chloride 10 mL/hr at 03/11/12 1500   PRN Meds:.hydrALAZINE, LORazepam, LORazepam, morphine injection, [COMPLETED] nitroGLYCERIN, ondansetron (ZOFRAN) IV, ondansetron   2D ECHO on 03/11/2012 Study Conclusions - Left ventricle: The cavity size was normal. Wall thickness was increased in a pattern of mild LVH. There was mild concentric hypertrophy. Systolic function was normal. The estimated ejection fraction was in the range of 55% to 60%. Wall motion was normal; there were no regional wall motion abnormalities. The study is not technically sufficient to allow evaluation of LV diastolic function. - Aortic valve: Trileaflet; mildly thickened leaflets. Mild regurgitation. - Mitral valve: Mild regurgitation. - Left atrium: The atrium was moderately dilated.  Assessment/Plan: Chest pain  Etiology unclear, resolved. Appreciate cardiology input. Patient ruled out for acute coronary syndrome with negative troponins. As today is Thanksgiving no staff available to perform stress test, per cardiology patient can be discharged and have outpatient stress test with followup (to be arranged by cardiology). Discontinue IV heparin. Patient has an allergy to aspirin. 2D ECHO done with results as indicated above.  A. Fib  Rate controlled. IV heparin discontinued as patient is ruled out for acute coronary syndrome. Initally cardiology considered ASA for stroke prevention, however has an allergy. Patient does not want to take coumadin. Patient at this time was deemed not a candidate for anticoagulation given daily alcohol use. Patient was instructed that once he demonstrates compliance with alcohol cessation, cardiology will discuss anticoagulation at the next clinic visit.  Uncontrolled hypertension  Improved with coreg and lisinopril. Titrate as  outpatient, patient provided resources for finding a PCP at discharge.  Type 2 diabetes uncontrolled with complications  Hemoglobin A1c 9.3 on 03/11/2012 suggesting an average blood sugar 220. Initially started on Lantus and sliding scale insulin. Patient at this time does not want to take insulin, suspect compliance may be an issue with insulin after discharge as a result will start him on oral diabetic medications. Will also arrange for outpatient teaching and education. Will have his PCP followup.   Alcohol use  Continue to monitor, no signs of withdrawal. Initially started on CIWA protocol. Continue thiamine and folic acid after discharge. Counseled on cessation.   Hyperlipidemia Start statin at discharge.  No primary care physician  Case manager provided patient with resources for finding PCP.   Prophylaxis  IV heparin.   CODE STATUS  Full code.   Disposition  Change to observation. Discharge patient today.  LOS: 1 day  Maico Mulvehill A, MD 03/12/2012, 9:30 AM

## 2012-03-12 NOTE — Care Management Note (Signed)
    Page 1 of 1   03/12/2012     10:33:20 AM   CARE MANAGEMENT NOTE 03/12/2012  Patient:  Luke Garcia, Luke Garcia   Account Number:  [id number]  Date Initiated:  03/12/2012  Documentation initiated by:  Tera Mater  Subjective/Objective Assessment:   61yo male admitted with chest pain. Pt. lives in [place] with spouse.     Action/Plan:   In to speak with pt. about PCP.  Pt. states he has seen Dr. Tenny Craw from [facility] in past, however, since he has no insurance he is not able to see him now.   Anticipated DC Date:  03/12/2012   Anticipated DC Plan:  HOME/SELF CARE      DC Planning Services  CM consult      Choice offered to / List presented to:             Status of service:  Completed, signed off Medicare Important Message given?   (If response is "NO", the following Medicare IM given date fields will be blank) Date Medicare IM given:   Date Additional Medicare IM given:    Discharge Disposition:  HOME/SELF CARE  Per UR Regulation:  Reviewed for med. necessity/level of care/duration of stay  If discussed at Long Length of Stay Meetings, dates discussed:    Comments:  03/12/12 1015 Provided pt. with list of Primary Care Resources here in [place], such as the [employer], and [employer] [facility] [place], and gave pt. number to [facility].  Pt. to dc home today. Tera Mater, RN, BSN NCM [phone]

## 2012-03-12 NOTE — Progress Notes (Signed)
ANTICOAGULATION CONSULT NOTE - Follow Up Consult  Pharmacy Consult for Heparin Indication: chest pain/ACS  Allergies  Allergen Reactions  . Aspirin Anaphylaxis  . Nsaids Anaphylaxis  . Rhubarb   . Strawberry     Patient Measurements: Height: 6' (182.9 cm) Weight: 241 lb 2.9 oz (109.4 kg) IBW/kg (Calculated) : 77.6  Heparin Dosing Weight: 101 kg  Vital Signs: Temp: 97.5 F (36.4 C) (11/28 0745) Temp src: Oral (11/28 0745) BP: 143/66 mmHg (11/28 0745) Pulse Rate: 76  (11/28 0745)  Labs:  Basename 03/12/12 0435 03/11/12 2240 03/11/12 1820 03/11/12 1000  HGB 13.4 -- -- 15.0  HCT 38.6* -- -- 40.9  PLT 129* -- -- 151  APTT -- -- -- --  LABPROT 14.0 -- -- --  INR 1.09 -- -- --  HEPARINUNFRC 0.41 0.32 -- --  CREATININE 0.84 -- -- 0.80  CKTOTAL -- -- -- --  CKMB -- -- -- --  TROPONINI <0.30 <0.30 <0.30 --    Estimated Creatinine Clearance: 119.4 ml/min (by C-G formula based on Cr of 0.84).   Medical History: Past Medical History  Diagnosis Date  . Hypertension   . Diabetes mellitus without complication     Medications:  Prescriptions prior to admission  Medication Sig Dispense Refill  . Homeopathic Products (BERBERIS HOMACCORD PO) Take 4 capsules by mouth daily. Blood sugar        Assessment: 61 y.o. male presents with chest pain on IV heparin for r/o ACS. Noted for [employer] tomorrow.  Heparin level (0.41) is therapeutic this morning on 1600 units/hr. H/H and platelets down slightly.  Goal of Therapy:  Heparin level 0.3-0.7 units/ml Monitor platelets by anticoagulation protocol: Yes   Plan:  1. Continue IV heparin to 1600 units/hr.  2. Daily CBC, heparin level.   Celedonio Miyamoto, PharmD, BCPS Clinical Pharmacist Pager [phone]

## 2012-03-12 NOTE — Progress Notes (Signed)
Discussed with Dr Royann Shivers. We had decided on ASA 162mg  daily for Mr Gambino but he now says he has had an "anaphylactic" reaction to ASA in the past. He is not a candidate for an oral anticoagulant, daily ETOH, ? Compliance, he also refused to consider Coumadin. He will not be able to afford Plavix.   Corine Shelter PA-C 03/12/2012 10:00 AM

## 2012-03-12 NOTE — Discharge Summary (Signed)
Physician Discharge Summary  Luke Garcia WUJ:[id number] DOB: 1950-10-27 DOA: 03/11/2012  PCP: No primary provider on file.  Admit date: 03/11/2012 Discharge date: 03/12/2012   Recommendations for Outpatient Follow-up:  Patient to establish and followup with PCP for management of your blood pressure and diabeties.  Patient to followup with [facility] and Vascular (cardiology) for stress test, atrial fibrillation management, and need for anticoagulation given your atrial fibrillation.  Discharge Diagnoses:  Principal Problem:  *Chest pain, Troponin negative X 3 Active Problems:  DM (diabetes mellitus), type 2, uncontrolled with complications  Uncontrolled hypertension  A-fib, unknown duration  Alcohol abuse, daily use  Dyslipidemia  Discharge Condition: Stable  Diet recommendation: Diabetic diet.  Filed Weights   03/11/12 0950 03/12/12 0458  Weight: 109.317 kg (241 lb) 109.4 kg (241 lb 2.9 oz)    History of present illness:  Luke Garcia is a 61 y.o. Caucasian male history of hypertension, diabetes, and chronic A. fib not on anticoagulation who presented with chest pain on 03/11/2012.  Hospital Course:  Chest pain  Etiology unclear, resolved. Appreciate cardiology input. Patient ruled out for acute coronary syndrome with negative troponins. As today is Thanksgiving no staff available to perform stress test, per cardiology patient can be discharged and have outpatient stress test with followup (to be arranged by cardiology). Discontinue IV heparin. Patient has an allergy to aspirin. 2D ECHO done with results as indicated above. Started on beta-blocker.  A. Fib  Rate controlled. IV heparin discontinued as patient is ruled out for acute coronary syndrome. Initally cardiology considered ASA for stroke prevention, however has an allergy. Patient does not want to take coumadin. Patient at this time was deemed not a candidate for anticoagulation given daily alcohol use. Patient  was instructed that once he demonstrates compliance with alcohol cessation, cardiology will discuss anticoagulation at the next clinic visit.   Uncontrolled hypertension  Improved with coreg and lisinopril. Titrate as outpatient, patient provided resources for finding a PCP at discharge.   Type 2 diabetes uncontrolled with complications  Hemoglobin A1c 9.3 on 03/11/2012 suggesting an average blood sugar 220. Initially started on Lantus and sliding scale insulin. Patient at this time does not want to take insulin, suspect compliance may be an issue with insulin after discharge as a result will start him on oral diabetic medications. Will also arrange for outpatient teaching and education. Will have his PCP followup.   Alcohol use  Continue to monitor, no signs of withdrawal. Initially started on CIWA protocol. Continue thiamine and folic acid after discharge. Counseled on cessation.   Hyperlipidemia  Start statin at discharge.   No primary care physician  Case manager provided patient with resources for finding PCP.   Procedures: 2D ECHO on 03/11/2012  Study Conclusions - Left ventricle: The cavity size was normal. Wall thickness was increased in a pattern of mild LVH. There was mild concentric hypertrophy. Systolic function was normal. The estimated ejection fraction was in the range of 55% to 60%. Wall motion was normal; there were no regional wall motion abnormalities. The study is not technically sufficient to allow evaluation of LV diastolic function. - Aortic valve: Trileaflet; mildly thickened leaflets. Mild regurgitation. - Mitral valve: Mild regurgitation. - Left atrium: The atrium was moderately dilated.  Consultations:  [facility], cardiology.  Discharge Exam: Filed Vitals:   03/12/12 0300 03/12/12 0400 03/12/12 0458 03/12/12 0745  BP: 139/77   143/66  Pulse:    76  Temp:  97.4 F (36.3 C)  97.5  F (36.4 C)  TempSrc:  Oral  Oral  Resp:    18  Height:      Weight:   109.4  kg (241 lb 2.9 oz)   SpO2: 97% 98%  98%   Discharge Instructions  Discharge Orders    Future Orders Please Complete By Expires   Ambulatory referral to Nutrition and Diabetic Education      Diet Carb Modified      Increase activity slowly      Discharge instructions      Comments:   Please establish and followup with PCP for management of your blood pressure and diabeties.  Please followup with [facility] and Vascular (cardiology) for stress test, atrial fibrillation management, and need for anticoagulation given your atrial fibrillation.       Medication List     As of 03/12/2012 11:22 AM    STOP taking these medications         BERBERIS HOMACCORD PO      TAKE these medications         carvedilol 12.5 MG tablet   Commonly known as: COREG   Take 1 tablet (12.5 mg total) by mouth 2 (two) times daily with a meal.      folic acid 1 MG tablet   Commonly known as: FOLVITE   Take 1 tablet (1 mg total) by mouth daily.      glipiZIDE 2.5 mg Tabs   Commonly known as: GLUCOTROL   Take 0.5 tablets (2.5 mg total) by mouth 2 (two) times daily before a meal.      lisinopril 5 MG tablet   Commonly known as: PRINIVIL,ZESTRIL   Take 1 tablet (5 mg total) by mouth daily.      lovastatin 20 MG tablet   Commonly known as: MEVACOR   Take 1 tablet (20 mg total) by mouth at bedtime.      metFORMIN 500 MG tablet   Commonly known as: GLUCOPHAGE   Take 1 tablet (500 mg total) by mouth 2 (two) times daily with a meal.      multivitamin with minerals Tabs   Take 1 tablet by mouth daily.      thiamine 100 MG tablet   Take 1 tablet (100 mg total) by mouth daily.           Follow-up Information    Follow up with Abelino Derrick, PA. (office will call you)    Contact information:   [address] [place] [address] [phone]           The results of significant diagnostics from this hospitalization (including imaging, microbiology, ancillary and  laboratory) are listed below for reference.    Significant Diagnostic Studies: Dg Chest 2 View  03/11/2012  *RADIOLOGY REPORT*  Clinical Data: Chest pain.  Flaring of the chest.  Nausea and dizziness.  History of diabetes and hypertension.  CHEST - 2 VIEW  Comparison: None.  Findings:  Cardiopericardial silhouette within normal limits. Mediastinal contours normal. Trachea midline.  No airspace disease or effusion. Monitoring leads are projected over the chest.  Mild wedging of lower thoracic vertebral bodies is probably chronic.  IMPRESSION: No active cardiopulmonary disease.   Original Report Authenticated By: Andreas Newport, M.D.     Microbiology: Recent Results (from the past 240 hour(s))  MRSA PCR SCREENING     Status: Normal   Collection Time   03/11/12  2:31 PM      Component Value Range Status Comment  MRSA by PCR NEGATIVE  NEGATIVE Final      Labs: Basic Metabolic Panel:  Lab 03/12/12 1610 03/11/12 1000  NA 137 136  K 3.9 4.1  CL 97 95*  CO2 32 28  GLUCOSE 196* 236*  BUN 11 13  CREATININE 0.84 0.80  CALCIUM 9.0 10.0  MG -- --  PHOS -- --   Liver Function Tests:  Lab 03/11/12 1000  AST 38*  ALT 44  ALKPHOS 71  BILITOT 0.7  PROT 8.2  ALBUMIN 4.7   No results found for this basename: LIPASE:5,AMYLASE:5 in the last 168 hours No results found for this basename: AMMONIA:5 in the last 168 hours CBC:  Lab 03/12/12 0435 03/11/12 1000  WBC 8.9 8.9  NEUTROABS -- 5.5  HGB 13.4 15.0  HCT 38.6* 40.9  MCV 92.8 91.9  PLT 129* 151   Cardiac Enzymes:  Lab 03/12/12 0435 03/11/12 2240 03/11/12 1820 03/11/12 1000  CKTOTAL -- -- -- --  CKMB -- -- -- --  CKMBINDEX -- -- -- --  TROPONINI <0.30 <0.30 <0.30 <0.30   BNP: BNP (last 3 results) No results found for this basename: PROBNP:3 in the last 8760 hours CBG:  Lab 03/12/12 0755 03/11/12 2048 03/11/12 1636 03/11/12 1011  GLUCAP 180* 200* 192* 217*    Time spent: 25 minutes   Signed:  Lakayla Barrington A  [facility] 03/12/2012, 11:22 AM

## 2012-03-13 MED FILL — Morphine Sulfate Inj 10 MG/ML: INTRAMUSCULAR | Qty: 1 | Status: AC

## 2012-03-17 ENCOUNTER — Other Ambulatory Visit (HOSPITAL_COMMUNITY): Payer: Self-pay | Admitting: Cardiovascular Disease

## 2012-03-17 DIAGNOSIS — R079 Chest pain, unspecified: Secondary | ICD-10-CM

## 2012-03-26 ENCOUNTER — Encounter (HOSPITAL_COMMUNITY)
Admission: RE | Admit: 2012-03-26 | Discharge: 2012-03-26 | Disposition: A | Discharge status: Home / Self Care | Payer: Self-pay | Source: Ambulatory Visit | Attending: Cardiovascular Disease | Admitting: Cardiovascular Disease

## 2012-03-26 DIAGNOSIS — R079 Chest pain, unspecified: Secondary | ICD-10-CM | POA: Insufficient documentation

## 2012-03-26 MED ORDER — TECHNETIUM TC 99M SESTAMIBI GENERIC - CARDIOLITE
10.0000 | Freq: Once | INTRAVENOUS | Status: AC | PRN
  Administered 2012-03-26: 10 via INTRAVENOUS

## 2012-03-26 MED ORDER — REGADENOSON 0.4 MG/5ML IV SOLN
0.4000 mg | Freq: Once | INTRAVENOUS | Status: AC
  Administered 2012-03-26: 0.4 mg via INTRAVENOUS

## 2012-03-26 MED ORDER — REGADENOSON 0.4 MG/5ML IV SOLN
INTRAVENOUS | Status: AC
  Filled 2012-03-26: qty 5

## 2012-03-26 MED ORDER — TECHNETIUM TC 99M SESTAMIBI - CARDIOLITE
30.0000 | Freq: Once | INTRAVENOUS | Status: AC | PRN
  Administered 2012-03-26: 11:00:00 30 via INTRAVENOUS

## 2019-02-22 ENCOUNTER — Emergency Department (HOSPITAL_COMMUNITY): Payer: Medicaid Other

## 2019-02-22 ENCOUNTER — Observation Stay (HOSPITAL_COMMUNITY): Payer: Medicaid Other

## 2019-02-22 ENCOUNTER — Encounter (HOSPITAL_COMMUNITY): Payer: Self-pay

## 2019-02-22 ENCOUNTER — Inpatient Hospital Stay (HOSPITAL_COMMUNITY)
Admission: EM | Admit: 2019-02-22 | Discharge: 2019-03-16 | DRG: 871 | Disposition: E | Payer: Medicaid Other | Attending: Pulmonary Disease | Admitting: Pulmonary Disease

## 2019-02-22 ENCOUNTER — Other Ambulatory Visit: Payer: Self-pay

## 2019-02-22 DIAGNOSIS — I5033 Acute on chronic diastolic (congestive) heart failure: Secondary | ICD-10-CM | POA: Diagnosis present

## 2019-02-22 DIAGNOSIS — D696 Thrombocytopenia, unspecified: Secondary | ICD-10-CM | POA: Diagnosis present

## 2019-02-22 DIAGNOSIS — E869 Volume depletion, unspecified: Secondary | ICD-10-CM | POA: Diagnosis present

## 2019-02-22 DIAGNOSIS — Z7189 Other specified counseling: Secondary | ICD-10-CM

## 2019-02-22 DIAGNOSIS — I482 Chronic atrial fibrillation, unspecified: Secondary | ICD-10-CM | POA: Diagnosis present

## 2019-02-22 DIAGNOSIS — I079 Rheumatic tricuspid valve disease, unspecified: Secondary | ICD-10-CM | POA: Diagnosis present

## 2019-02-22 DIAGNOSIS — K7031 Alcoholic cirrhosis of liver with ascites: Secondary | ICD-10-CM | POA: Diagnosis present

## 2019-02-22 DIAGNOSIS — N390 Urinary tract infection, site not specified: Secondary | ICD-10-CM | POA: Diagnosis present

## 2019-02-22 DIAGNOSIS — G9349 Other encephalopathy: Secondary | ICD-10-CM | POA: Diagnosis not present

## 2019-02-22 DIAGNOSIS — A419 Sepsis, unspecified organism: Secondary | ICD-10-CM | POA: Diagnosis present

## 2019-02-22 DIAGNOSIS — I38 Endocarditis, valve unspecified: Secondary | ICD-10-CM | POA: Diagnosis present

## 2019-02-22 DIAGNOSIS — R531 Weakness: Secondary | ICD-10-CM

## 2019-02-22 DIAGNOSIS — E872 Acidosis, unspecified: Secondary | ICD-10-CM | POA: Diagnosis present

## 2019-02-22 DIAGNOSIS — Z91018 Allergy to other foods: Secondary | ICD-10-CM

## 2019-02-22 DIAGNOSIS — I639 Cerebral infarction, unspecified: Secondary | ICD-10-CM | POA: Diagnosis present

## 2019-02-22 DIAGNOSIS — I330 Acute and subacute infective endocarditis: Secondary | ICD-10-CM | POA: Diagnosis present

## 2019-02-22 DIAGNOSIS — Z66 Do not resuscitate: Secondary | ICD-10-CM | POA: Diagnosis not present

## 2019-02-22 DIAGNOSIS — E1122 Type 2 diabetes mellitus with diabetic chronic kidney disease: Secondary | ICD-10-CM | POA: Diagnosis present

## 2019-02-22 DIAGNOSIS — Z9114 Patient's other noncompliance with medication regimen: Secondary | ICD-10-CM

## 2019-02-22 DIAGNOSIS — R7881 Bacteremia: Secondary | ICD-10-CM | POA: Diagnosis present

## 2019-02-22 DIAGNOSIS — A411 Sepsis due to other specified staphylococcus: Principal | ICD-10-CM | POA: Diagnosis present

## 2019-02-22 DIAGNOSIS — R471 Dysarthria and anarthria: Secondary | ICD-10-CM | POA: Diagnosis present

## 2019-02-22 DIAGNOSIS — D649 Anemia, unspecified: Secondary | ICD-10-CM | POA: Diagnosis present

## 2019-02-22 DIAGNOSIS — N179 Acute kidney failure, unspecified: Secondary | ICD-10-CM | POA: Diagnosis present

## 2019-02-22 DIAGNOSIS — E871 Hypo-osmolality and hyponatremia: Secondary | ICD-10-CM | POA: Diagnosis not present

## 2019-02-22 DIAGNOSIS — Z7901 Long term (current) use of anticoagulants: Secondary | ICD-10-CM

## 2019-02-22 DIAGNOSIS — Z9109 Other allergy status, other than to drugs and biological substances: Secondary | ICD-10-CM

## 2019-02-22 DIAGNOSIS — N189 Chronic kidney disease, unspecified: Secondary | ICD-10-CM | POA: Clinically undetermined

## 2019-02-22 DIAGNOSIS — R6521 Severe sepsis with septic shock: Secondary | ICD-10-CM | POA: Diagnosis not present

## 2019-02-22 DIAGNOSIS — IMO0002 Reserved for concepts with insufficient information to code with codable children: Secondary | ICD-10-CM | POA: Diagnosis present

## 2019-02-22 DIAGNOSIS — I69391 Dysphagia following cerebral infarction: Secondary | ICD-10-CM

## 2019-02-22 DIAGNOSIS — D72829 Elevated white blood cell count, unspecified: Secondary | ICD-10-CM | POA: Diagnosis present

## 2019-02-22 DIAGNOSIS — I130 Hypertensive heart and chronic kidney disease with heart failure and stage 1 through stage 4 chronic kidney disease, or unspecified chronic kidney disease: Secondary | ICD-10-CM | POA: Diagnosis present

## 2019-02-22 DIAGNOSIS — Z8249 Family history of ischemic heart disease and other diseases of the circulatory system: Secondary | ICD-10-CM

## 2019-02-22 DIAGNOSIS — J189 Pneumonia, unspecified organism: Secondary | ICD-10-CM | POA: Diagnosis not present

## 2019-02-22 DIAGNOSIS — R000 Tachycardia, unspecified: Secondary | ICD-10-CM | POA: Diagnosis present

## 2019-02-22 DIAGNOSIS — Z886 Allergy status to analgesic agent status: Secondary | ICD-10-CM

## 2019-02-22 DIAGNOSIS — I4891 Unspecified atrial fibrillation: Secondary | ICD-10-CM | POA: Insufficient documentation

## 2019-02-22 DIAGNOSIS — R1310 Dysphagia, unspecified: Secondary | ICD-10-CM | POA: Diagnosis present

## 2019-02-22 DIAGNOSIS — I9589 Other hypotension: Secondary | ICD-10-CM | POA: Diagnosis present

## 2019-02-22 DIAGNOSIS — Z20828 Contact with and (suspected) exposure to other viral communicable diseases: Secondary | ICD-10-CM | POA: Diagnosis present

## 2019-02-22 DIAGNOSIS — Z800 Family history of malignant neoplasm of digestive organs: Secondary | ICD-10-CM

## 2019-02-22 DIAGNOSIS — R739 Hyperglycemia, unspecified: Secondary | ICD-10-CM

## 2019-02-22 DIAGNOSIS — Z7984 Long term (current) use of oral hypoglycemic drugs: Secondary | ICD-10-CM

## 2019-02-22 DIAGNOSIS — Z79899 Other long term (current) drug therapy: Secondary | ICD-10-CM

## 2019-02-22 DIAGNOSIS — D65 Disseminated intravascular coagulation [defibrination syndrome]: Secondary | ICD-10-CM | POA: Diagnosis present

## 2019-02-22 DIAGNOSIS — G9340 Encephalopathy, unspecified: Secondary | ICD-10-CM | POA: Diagnosis present

## 2019-02-22 DIAGNOSIS — I10 Essential (primary) hypertension: Secondary | ICD-10-CM | POA: Diagnosis present

## 2019-02-22 DIAGNOSIS — F1010 Alcohol abuse, uncomplicated: Secondary | ICD-10-CM | POA: Diagnosis present

## 2019-02-22 DIAGNOSIS — K7030 Alcoholic cirrhosis of liver without ascites: Secondary | ICD-10-CM | POA: Diagnosis present

## 2019-02-22 DIAGNOSIS — Z515 Encounter for palliative care: Secondary | ICD-10-CM | POA: Diagnosis not present

## 2019-02-22 DIAGNOSIS — E1165 Type 2 diabetes mellitus with hyperglycemia: Secondary | ICD-10-CM | POA: Diagnosis not present

## 2019-02-22 HISTORY — DX: Unspecified atrial fibrillation: I48.91

## 2019-02-22 LAB — URINALYSIS, ROUTINE W REFLEX MICROSCOPIC
Bacteria, UA: NONE SEEN
Bilirubin Urine: NEGATIVE
Glucose, UA: 150 mg/dL — AB
Ketones, ur: NEGATIVE mg/dL
Nitrite: POSITIVE — AB
Protein, ur: NEGATIVE mg/dL
Specific Gravity, Urine: 1.018 (ref 1.005–1.030)
pH: 5 (ref 5.0–8.0)

## 2019-02-22 LAB — BASIC METABOLIC PANEL
Anion gap: 13 (ref 5–15)
BUN: 49 mg/dL — ABNORMAL HIGH (ref 8–23)
CO2: 20 mmol/L — ABNORMAL LOW (ref 22–32)
Calcium: 7.5 mg/dL — ABNORMAL LOW (ref 8.9–10.3)
Chloride: 96 mmol/L — ABNORMAL LOW (ref 98–111)
Creatinine, Ser: 2.13 mg/dL — ABNORMAL HIGH (ref 0.61–1.24)
GFR calc Af Amer: 36 mL/min — ABNORMAL LOW (ref >60)
GFR calc non Af Amer: 31 mL/min — ABNORMAL LOW (ref >60)
Glucose, Bld: 421 mg/dL — ABNORMAL HIGH (ref 70–99)
Potassium: 4.1 mmol/L (ref 3.5–5.1)
Sodium: 129 mmol/L — ABNORMAL LOW (ref 135–145)

## 2019-02-22 LAB — MAGNESIUM: Magnesium: 1.2 mg/dL — ABNORMAL LOW (ref 1.7–2.4)

## 2019-02-22 LAB — RAPID URINE DRUG SCREEN, HOSP PERFORMED
Amphetamines: NOT DETECTED
Barbiturates: NOT DETECTED
Benzodiazepines: NOT DETECTED
Cocaine: NOT DETECTED
Opiates: NOT DETECTED
Tetrahydrocannabinol: NOT DETECTED

## 2019-02-22 LAB — CBC
HCT: 26.6 % — ABNORMAL LOW (ref 39.0–52.0)
Hemoglobin: 8.6 g/dL — ABNORMAL LOW (ref 13.0–17.0)
MCH: 36.8 pg — ABNORMAL HIGH (ref 26.0–34.0)
MCHC: 32.3 g/dL (ref 30.0–36.0)
MCV: 113.7 fL — ABNORMAL HIGH (ref 80.0–100.0)
Platelets: 37 10*3/uL — ABNORMAL LOW (ref 150–400)
RBC: 2.34 MIL/uL — ABNORMAL LOW (ref 4.22–5.81)
RDW: 14.9 % (ref 11.5–15.5)
WBC: 26.6 10*3/uL — ABNORMAL HIGH (ref 4.0–10.5)
nRBC: 0 % (ref 0.0–0.2)

## 2019-02-22 LAB — GLUCOSE, CAPILLARY
Glucose-Capillary: 377 mg/dL — ABNORMAL HIGH (ref 70–99)
Glucose-Capillary: 362 mg/dL — ABNORMAL HIGH (ref 70–99)

## 2019-02-22 LAB — MRSA PCR SCREENING: MRSA by PCR: NEGATIVE

## 2019-02-22 LAB — BRAIN NATRIURETIC PEPTIDE: B Natriuretic Peptide: 544 pg/mL — ABNORMAL HIGH (ref 0.0–100.0)

## 2019-02-22 LAB — HEMOGLOBIN A1C
Hgb A1c MFr Bld: 6.8 % — ABNORMAL HIGH (ref 4.8–5.6)
Mean Plasma Glucose: 148.46 mg/dL

## 2019-02-22 LAB — TROPONIN I (HIGH SENSITIVITY)
Troponin I (High Sensitivity): 11 ng/L (ref <18)
Troponin I (High Sensitivity): 12 ng/L (ref <18)

## 2019-02-22 LAB — HEPATIC FUNCTION PANEL
ALT: 19 U/L (ref 0–44)
AST: 35 U/L (ref 15–41)
Albumin: 1.9 g/dL — ABNORMAL LOW (ref 3.5–5.0)
Alkaline Phosphatase: 137 U/L — ABNORMAL HIGH (ref 38–126)
Bilirubin, Direct: 1.9 mg/dL — ABNORMAL HIGH (ref 0.0–0.2)
Indirect Bilirubin: 2.7 mg/dL — ABNORMAL HIGH (ref 0.3–0.9)
Total Bilirubin: 4.6 mg/dL — ABNORMAL HIGH (ref 0.3–1.2)
Total Protein: 7.3 g/dL (ref 6.5–8.1)

## 2019-02-22 LAB — TSH: TSH: 4.865 u[IU]/mL — ABNORMAL HIGH (ref 0.350–4.500)

## 2019-02-22 LAB — CBG MONITORING, ED
Glucose-Capillary: 384 mg/dL — ABNORMAL HIGH (ref 70–99)
Glucose-Capillary: 380 mg/dL — ABNORMAL HIGH (ref 70–99)

## 2019-02-22 LAB — SARS CORONAVIRUS 2 (TAT 6-24 HRS): SARS Coronavirus 2: NEGATIVE

## 2019-02-22 MED ORDER — SODIUM CHLORIDE 0.9% FLUSH: 3.0000 mL | INTRAVENOUS | Status: DC | PRN

## 2019-02-22 MED ORDER — POLYETHYLENE GLYCOL 3350 17 G PO PACK: 17.0000 g | PACK | Freq: Every day | ORAL | Status: DC | PRN

## 2019-02-22 MED ORDER — TRAZODONE HCL 50 MG PO TABS: 50.0000 mg | ORAL_TABLET | Freq: Every evening | ORAL | Status: DC | PRN

## 2019-02-22 MED ORDER — ADULT MULTIVITAMIN W/MINERALS CH
1.0000 | ORAL_TABLET | Freq: Every day | ORAL | Status: DC
  Administered 2019-02-24 – 2019-02-27 (×2): 1 via ORAL
  Filled 2019-02-22 (×3): qty 1

## 2019-02-22 MED ORDER — ACETAMINOPHEN 325 MG PO TABS: 650.0000 mg | ORAL_TABLET | Freq: Four times a day (QID) | ORAL | Status: DC | PRN

## 2019-02-22 MED ORDER — PRAVASTATIN SODIUM 10 MG PO TABS
20.0000 mg | ORAL_TABLET | Freq: Every day | ORAL | Status: DC
  Administered 2019-02-22: 20 mg via ORAL
  Filled 2019-02-22: qty 2

## 2019-02-22 MED ORDER — CARVEDILOL 3.125 MG PO TABS
3.1250 mg | ORAL_TABLET | Freq: Two times a day (BID) | ORAL | Status: DC
  Administered 2019-02-22 – 2019-02-23 (×2): 3.125 mg via ORAL
  Filled 2019-02-22 (×2): qty 1

## 2019-02-22 MED ORDER — CHLORHEXIDINE GLUCONATE CLOTH 2 % EX PADS
6.0000 | MEDICATED_PAD | Freq: Every day | CUTANEOUS | Status: DC
  Administered 2019-02-22 – 2019-02-28 (×6): 6 via TOPICAL

## 2019-02-22 MED ORDER — METOPROLOL TARTRATE 5 MG/5ML IV SOLN
5.0000 mg | Freq: Once | INTRAVENOUS | Status: AC
  Administered 2019-02-22: 5 mg via INTRAVENOUS
  Filled 2019-02-22: qty 5

## 2019-02-22 MED ORDER — ALBUTEROL SULFATE (2.5 MG/3ML) 0.083% IN NEBU: 2.5000 mg | INHALATION_SOLUTION | RESPIRATORY_TRACT | Status: DC | PRN

## 2019-02-22 MED ORDER — LORAZEPAM 2 MG/ML IJ SOLN
1.0000 mg | Freq: Four times a day (QID) | INTRAMUSCULAR | Status: DC | PRN
  Administered 2019-02-22 – 2019-02-24 (×2): 1 mg via INTRAVENOUS
  Filled 2019-02-22 (×2): qty 1

## 2019-02-22 MED ORDER — SODIUM CHLORIDE 0.9 % IV BOLUS
1000.0000 mL | Freq: Once | INTRAVENOUS | Status: AC
  Administered 2019-02-22: 1000 mL via INTRAVENOUS

## 2019-02-22 MED ORDER — HEPARIN SODIUM (PORCINE) 5000 UNIT/ML IJ SOLN: 5000.0000 [IU] | Freq: Three times a day (TID) | INTRAMUSCULAR | Status: DC

## 2019-02-22 MED ORDER — VITAMIN B-1 100 MG PO TABS
100.0000 mg | ORAL_TABLET | Freq: Every day | ORAL | Status: DC
  Administered 2019-02-22 – 2019-02-24 (×2): 100 mg via ORAL
  Filled 2019-02-22 (×3): qty 1

## 2019-02-22 MED ORDER — INSULIN ASPART 100 UNIT/ML ~~LOC~~ SOLN
0.0000 [IU] | Freq: Every day | SUBCUTANEOUS | Status: DC
  Administered 2019-02-22: 5 [IU] via SUBCUTANEOUS
  Administered 2019-02-24: 2 [IU] via SUBCUTANEOUS

## 2019-02-22 MED ORDER — FUROSEMIDE 10 MG/ML IJ SOLN
20.0000 mg | Freq: Two times a day (BID) | INTRAMUSCULAR | Status: DC
  Administered 2019-02-22 – 2019-02-24 (×4): 20 mg via INTRAVENOUS
  Filled 2019-02-22 (×4): qty 2

## 2019-02-22 MED ORDER — FOLIC ACID 1 MG PO TABS
1.0000 mg | ORAL_TABLET | Freq: Every day | ORAL | Status: DC
  Administered 2019-02-22 – 2019-02-24 (×2): 1 mg via ORAL
  Filled 2019-02-22 (×3): qty 1

## 2019-02-22 MED ORDER — DILTIAZEM HCL 25 MG/5ML IV SOLN
10.0000 mg | Freq: Once | INTRAVENOUS | Status: AC
  Administered 2019-02-22: 10 mg via INTRAVENOUS
  Filled 2019-02-22: qty 5

## 2019-02-22 MED ORDER — ONDANSETRON HCL 4 MG/2ML IJ SOLN
4.0000 mg | Freq: Four times a day (QID) | INTRAMUSCULAR | Status: DC | PRN
  Filled 2019-02-22: qty 2

## 2019-02-22 MED ORDER — SODIUM CHLORIDE 0.9% FLUSH
3.0000 mL | Freq: Two times a day (BID) | INTRAVENOUS | Status: DC
  Administered 2019-02-22 – 2019-02-24 (×4): 3 mL via INTRAVENOUS

## 2019-02-22 MED ORDER — GLIPIZIDE 5 MG PO TABS
2.5000 mg | ORAL_TABLET | Freq: Two times a day (BID) | ORAL | Status: DC
  Administered 2019-02-22 – 2019-02-24 (×4): 2.5 mg via ORAL
  Filled 2019-02-22 (×4): qty 1

## 2019-02-22 MED ORDER — INSULIN ASPART 100 UNIT/ML ~~LOC~~ SOLN
0.0000 [IU] | Freq: Three times a day (TID) | SUBCUTANEOUS | Status: DC
  Administered 2019-02-22: 9 [IU] via SUBCUTANEOUS
  Administered 2019-02-23 (×3): 3 [IU] via SUBCUTANEOUS
  Administered 2019-02-24: 2 [IU] via SUBCUTANEOUS
  Administered 2019-02-25 (×2): 3 [IU] via SUBCUTANEOUS
  Administered 2019-02-25: 2 [IU] via SUBCUTANEOUS

## 2019-02-22 MED ORDER — SODIUM CHLORIDE 0.9 % IV SOLN
250.0000 mL | INTRAVENOUS | Status: DC | PRN
  Administered 2019-02-23 (×2): 250 mL via INTRAVENOUS

## 2019-02-22 MED ORDER — ONDANSETRON HCL 4 MG PO TABS: 4.0000 mg | ORAL_TABLET | Freq: Four times a day (QID) | ORAL | Status: DC | PRN

## 2019-02-22 MED ORDER — ACETAMINOPHEN 650 MG RE SUPP: 650.0000 mg | Freq: Four times a day (QID) | RECTAL | Status: DC | PRN

## 2019-02-22 MED ORDER — DILTIAZEM HCL-DEXTROSE 125-5 MG/125ML-% IV SOLN (PREMIX)
5.0000 mg/h | INTRAVENOUS | Status: DC
Start: 2019-02-22 — End: 2019-02-23
  Administered 2019-02-22: 12:00:00 5 mg/h via INTRAVENOUS
  Administered 2019-02-23: 2.5 mg/h via INTRAVENOUS
  Filled 2019-02-22 (×2): qty 125

## 2019-02-22 NOTE — ED Notes (Signed)
Dr. Roderic Palau stated to DC fluids

## 2019-02-22 NOTE — ED Provider Notes (Signed)
[facility] EMERGENCY DEPARTMENT Provider Note   CSN: [id number] Arrival date & time: 2019/03/21  1041     History   Chief Complaint Chief Complaint  Patient presents with  . Hyperglycemia    HPI Luke Garcia is a 68 y.o. male.     Patient complains of weakness shortness of breath  The history is provided by the patient. No language interpreter was used.  Weakness Severity:  Moderate Onset quality:  Sudden Timing:  Constant Progression:  Worsening Chronicity:  New Context: not alcohol use   Relieved by:  Nothing Worsened by:  Activity Ineffective treatments:  None tried Associated symptoms: shortness of breath   Associated symptoms: no abdominal pain, no chest pain, no cough, no diarrhea, no frequency, no headaches and no seizures     Past Medical History:  Diagnosis Date  . Atrial fibrillation (HCC)   . Diabetes mellitus without complication (HCC)   . Hypertension     Patient Active Problem List   Diagnosis Date Noted  . New onset a-fib (HCC) 03-21-2019  . Dyslipidemia 03/12/2012  . Chest pain, Troponin negative X 3 03/11/2012  . DM (diabetes mellitus), type 2, uncontrolled with complications (HCC) 03/11/2012  . Uncontrolled hypertension 03/11/2012  . A-fib, unknown duration 03/11/2012  . Alcohol abuse, daily use 03/11/2012    No past surgical history on file.      Home Medications    Prior to Admission medications   Medication Sig Start Date End Date Taking? Authorizing Provider  metFORMIN (GLUCOPHAGE) 500 MG tablet Take 1 tablet (500 mg total) by mouth 2 (two) times daily with a meal. Patient taking differently: Take 500 mg by mouth daily as needed.  03/12/12  Yes Cristal Ford, MD  carvedilol (COREG) 12.5 MG tablet Take 1 tablet (12.5 mg total) by mouth 2 (two) times daily with a meal. Patient not taking: Reported on March 21, 2019 03/12/12   Cristal Ford, MD  folic acid (FOLVITE) 1 MG tablet Take 1 tablet (1 mg total) by mouth daily. Patient not  taking: Reported on 03/21/19 03/12/12   Cristal Ford, MD  glipiZIDE (GLUCOTROL) 2.5 mg TABS Take 0.5 tablets (2.5 mg total) by mouth 2 (two) times daily before a meal. Patient not taking: Reported on 21-Mar-2019 03/12/12   Cristal Ford, MD  lisinopril (PRINIVIL,ZESTRIL) 5 MG tablet Take 1 tablet (5 mg total) by mouth daily. Patient not taking: Reported on 03/21/19 03/12/12   Cristal Ford, MD  lovastatin (MEVACOR) 20 MG tablet Take 1 tablet (20 mg total) by mouth at bedtime. Patient not taking: Reported on 03-21-19 03/12/12   Cristal Ford, MD  Multiple Vitamin (MULTIVITAMIN WITH MINERALS) TABS Take 1 tablet by mouth daily. Patient not taking: Reported on 03/21/2019 03/12/12   Cristal Ford, MD  thiamine 100 MG tablet Take 1 tablet (100 mg total) by mouth daily. Patient not taking: Reported on 21-Mar-2019 03/12/12   Cristal Ford, MD    Family History Family History  Problem Relation Age of Onset  . Cancer - Other Mother   . Pancreatic cancer Father   . Congestive Heart Failure Father     Social History Social History   Tobacco Use  . Smoking status: Never Smoker  Substance Use Topics  . Alcohol use: Yes    Comment: Beer and vodka  . Drug use: No     Allergies   Aspirin, Nsaids, Rhubarb, Strawberry extract, and Tylenol [acetaminophen]   Review of Systems Review of Systems  Constitutional: Negative for appetite change and fatigue.  HENT: Negative for congestion, ear discharge and sinus pressure.   Eyes: Negative for discharge.  Respiratory: Positive for shortness of breath. Negative for cough.   Cardiovascular: Positive for palpitations. Negative for chest pain.  Gastrointestinal: Negative for abdominal pain and diarrhea.  Genitourinary: Negative for frequency and hematuria.  Musculoskeletal: Negative for back pain.  Skin: Negative for rash.  Neurological: Positive for weakness. Negative for seizures and headaches.  Psychiatric/Behavioral: Negative for  hallucinations.     Physical Exam Updated Vital Signs BP 120/80   Pulse (!) 105   Temp 98.3 F (36.8 C) (Oral)   Resp 18   SpO2 100%   Physical Exam Vitals signs and nursing note reviewed.  Constitutional:      Appearance: He is well-developed.  HENT:     Head: Normocephalic.     Nose: Nose normal.  Eyes:     General: No scleral icterus.    Conjunctiva/sclera: Conjunctivae normal.  Neck:     Musculoskeletal: Neck supple.     Thyroid: No thyromegaly.  Cardiovascular:     Rate and Rhythm: Tachycardia present. Rhythm irregular.     Heart sounds: No murmur. No friction rub. No gallop.   Pulmonary:     Breath sounds: No stridor. No wheezing or rales.  Chest:     Chest wall: No tenderness.  Abdominal:     General: There is no distension.     Tenderness: There is no abdominal tenderness. There is no rebound.  Musculoskeletal: Normal range of motion.  Lymphadenopathy:     Cervical: No cervical adenopathy.  Skin:    Findings: No erythema or rash.  Neurological:     Mental Status: He is oriented to person, place, and time.     Motor: No abnormal muscle tone.     Coordination: Coordination normal.  Psychiatric:        Behavior: Behavior normal.      ED Treatments / Results  Labs (all labs ordered are listed, but only abnormal results are displayed) Labs Reviewed  BASIC METABOLIC PANEL - Abnormal; Notable for the following components:      Result Value   Sodium 129 (*)    Chloride 96 (*)    CO2 20 (*)    Glucose, Bld 421 (*)    BUN 49 (*)    Creatinine, Ser 2.13 (*)    Calcium 7.5 (*)    GFR calc non Af Amer 31 (*)    GFR calc Af Amer 36 (*)    All other components within normal limits  CBC - Abnormal; Notable for the following components:   WBC 26.6 (*)    RBC 2.34 (*)    Hemoglobin 8.6 (*)    HCT 26.6 (*)    MCV 113.7 (*)    MCH 36.8 (*)    Platelets 37 (*)    All other components within normal limits  BRAIN NATRIURETIC PEPTIDE - Abnormal; Notable for  the following components:   B Natriuretic Peptide 544.0 (*)    All other components within normal limits  HEPATIC FUNCTION PANEL - Abnormal; Notable for the following components:   Albumin 1.9 (*)    Alkaline Phosphatase 137 (*)    Total Bilirubin 4.6 (*)    Bilirubin, Direct 1.9 (*)    Indirect Bilirubin 2.7 (*)    All other components within normal limits  CBG MONITORING, ED - Abnormal; Notable for the following components:   Glucose-Capillary 384 (*)  All other components within normal limits  SARS CORONAVIRUS 2 (TAT 6-24 HRS)  URINALYSIS, ROUTINE W REFLEX MICROSCOPIC  RAPID URINE DRUG SCREEN, HOSP PERFORMED  CBG MONITORING, ED  TROPONIN I (HIGH SENSITIVITY)  TROPONIN I (HIGH SENSITIVITY)    EKG EKG Interpretation  Date/Time:  Monday February 22 2019 10:51:11 EST Ventricular Rate:  139 PR Interval:    QRS Duration: 93 QT Interval:  311 QTC Calculation: 463 R Axis:   37 Text Interpretation: Atrial fibrillation Probable anteroseptal infarct, old Confirmed by Milton Ferguson [phone]) on 02/16/2019 12:38:45 PM   Radiology Dg Chest Portable 1 View  Result Date: 03/03/2019 CLINICAL DATA:  Shortness of breath and hyperglycemia for several days. EXAM: PORTABLE CHEST 1 VIEW COMPARISON:  PA and lateral chest 03/11/2012. FINDINGS: The lungs are clear. Heart size is normal. Aortic atherosclerosis noted. No pneumothorax or pleural fluid. No acute or focal bony abnormality. IMPRESSION: No acute disease. Atherosclerosis. Electronically Signed   By: Inge Rise M.D.   On: 03/08/2019 11:56    Procedures Procedures (including critical care time)  Medications Ordered in ED Medications  diltiazem (CARDIZEM) 125 mg in dextrose 5% 125 mL (1 mg/mL) infusion (5 mg/hr Intravenous New Bag/Given 02/18/2019 1224)  sodium chloride 0.9 % bolus 1,000 mL (0 mLs Intravenous Stopped 02/28/2019 1140)  diltiazem (CARDIZEM) injection 10 mg (10 mg Intravenous Given 03/05/2019 1208)  sodium chloride 0.9 %  bolus 1,000 mL (1,000 mLs Intravenous New Bag/Given 03/08/2019 1253)  metoprolol tartrate (LOPRESSOR) injection 5 mg (5 mg Intravenous Given 03/13/2019 1250)     Initial Impression / Assessment and Plan / ED Course  I have reviewed the triage vital signs and the nursing notes.  Pertinent labs & imaging results that were available during my care of the patient were reviewed by me and considered in my medical decision making (see chart for details).    CRITICAL CARE Performed by: Milton Ferguson Total critical care time: 40 minutes Critical care time was exclusive of separately billable procedures and treating other patients. Critical care was necessary to treat or prevent imminent or life-threatening deterioration. Critical care was time spent personally by me on the following activities: development of treatment plan with patient and/or surrogate as well as nursing, discussions with consultants, evaluation of patient's response to treatment, examination of patient, obtaining history from patient or surrogate, ordering and performing treatments and interventions, ordering and review of laboratory studies, ordering and review of radiographic studies, pulse oximetry and re-evaluation of patient's condition.      Patient with atrial fib with a rapid rate.  Patient was put on a Cardizem drip given cardio exam 10 mg IV along with Lopressor.  His rate was controlled at around 95.  He will be admitted to medicine Final Clinical Impressions(s) / ED Diagnoses   Final diagnoses:  Hyperglycemia    ED Discharge Orders    None       Milton Ferguson, MD 02/19/2019 1415

## 2019-02-22 NOTE — ED Notes (Signed)
Have given pt carb modified tray

## 2019-02-22 NOTE — H&P (Signed)
Patient Demographics:    Luke Garcia, is a 68 y.o. male  MRN: [id number]   DOB - [birth year]  Admit Date - 03/11/2019  Outpatient Primary MD for the patient is Patient, No Pcp Per   Assessment & Plan:    Principal Problem:   A-fib, unknown duration Active Problems:   DM (diabetes mellitus), type 2, uncontrolled with complications (HCC)   HTN (hypertension)   Weakness generalized    1)Atrial Fibrillation with RVR--???  Duration  CHA2DS2- VASc score   is = 3 (Age, HTN and DM)   Which is  equal to = 3.2  % annual risk of stroke  -However patient is not a candidate for anticoagulation due to patient platelet count of 37 and hemoglobin of 8.6 -Check serum magnesium, check TSH -Echocardiogram pending -Continue IV Cardizem drip and p.o. metoprolol for rate control  2)DM-blood sugar over 500 on presentation, check A1c -Do not restart metformin due to kidney function, Use Novolog/Humalog Sliding scale insulin with Accu-Cheks/Fingersticks as ordered   3)AKI Vs ----acute kidney injury on CKD unknown stage -     -No recent renal function labs available -Creatinine at this time is 2.1, gap is 20 with a potassium of 4.1 and a BUN of 49 -Suspect some degree of underlying CKD due to longstanding HTN and DM in the patient with noncompliance     , renally adjust medications, avoid nephrotoxic agents/dehydration/hypotension  -Watch renal function closely with diuresis -Do not restart lisinopril  4)CHF--EF unknown, systolic versus diastolic--patient with edema and shortness of breath -Suspect worsening CHF due to A. fib with RVR -Patient with shortness of breath, lower extremity edema and BNP of 544 -IV Lasix as ordered, daily weight and fluid input and output monitoring -Echocardiogram pending -Troponin is flat and not  elevated -EKG without ACS pattern  5)Leukocytosis--WBC is 26.6, no fevers, chest x-ray without acute findings, - UA and blood cultures ordered -??  Reactive, hold off on antibiotics  6)Generalized weakness--left more than right, CT head pending -Get physical therapy eval  7)Social/Ethics--patient is a full code, Pt lives in camper behind friends house. Noted to have multiple bruises on body--- he is not compliant with medications --Social work consult requested   With History of - Reviewed by me  Past Medical History:  Diagnosis Date  . Atrial fibrillation ([place])   . Diabetes mellitus without complication ([place])   . Hypertension       History reviewed. No pertinent surgical history.    Chief Complaint  Patient presents with  . Hyperglycemia      HPI:    Luke Garcia  is a 68 y.o. male with past medical history relevant for prior history of alcohol abuse, patient denies current use, hypertension and DM who  lives in camper behind friends house-presents to the ED with shortness of breath and noted to have multiple bruises on body. -Patient denies falls or syncope -Fevers or chills denies productive cough denies  abdominal pain or chest pain - -In the ED found to be in A. fib with RVR with heart rate up to 150s -In ED CBG with glucose over 500 -WBC is over 26K, UA and blood cultures requested chest x-ray is nonacute -In ED is found to have a creatinine of 2.1 baseline not available sodium is down to 129 with a chloride of 96 -BNP is elevated at 544 -Serial troponin flat and not elevated  -Patient was started on IV Cardizem drip for rate control in the ED    Review of systems:    In addition to the HPI above,   A full Review of  Systems was done, all other systems reviewed are negative except as noted above in HPI , .    Social History:  Reviewed by me    Social History   Tobacco Use  . Smoking status: Never Smoker  . Smokeless tobacco: Never Used  Substance  Use Topics  . Alcohol use: Yes    Comment: Beer and vodka     Family History :  Reviewed by me    Family History  Problem Relation Age of Onset  . Cancer - Other Mother   . Pancreatic cancer Father   . Congestive Heart Failure Father      Home Medications:   Prior to Admission medications   Medication Sig Start Date End Date Taking? Authorizing Provider  metFORMIN (GLUCOPHAGE) 500 MG tablet Take 1 tablet (500 mg total) by mouth 2 (two) times daily with a meal. Patient taking differently: Take 500 mg by mouth daily as needed.  03/12/12  Yes Cristal Fordeddy, Srikar A, MD  carvedilol (COREG) 12.5 MG tablet Take 1 tablet (12.5 mg total) by mouth 2 (two) times daily with a meal. Patient not taking: Reported on 02/27/2019 03/12/12   Cristal Fordeddy, Srikar A, MD  folic acid (FOLVITE) 1 MG tablet Take 1 tablet (1 mg total) by mouth daily. Patient not taking: Reported on 03/02/2019 03/12/12   Cristal Fordeddy, Srikar A, MD  glipiZIDE (GLUCOTROL) 2.5 mg TABS Take 0.5 tablets (2.5 mg total) by mouth 2 (two) times daily before a meal. Patient not taking: Reported on 02/26/2019 03/12/12   Cristal Fordeddy, Srikar A, MD  lisinopril (PRINIVIL,ZESTRIL) 5 MG tablet Take 1 tablet (5 mg total) by mouth daily. Patient not taking: Reported on 03/10/2019 03/12/12   Cristal Fordeddy, Srikar A, MD  lovastatin (MEVACOR) 20 MG tablet Take 1 tablet (20 mg total) by mouth at bedtime. Patient not taking: Reported on 02/15/2019 03/12/12   Cristal Fordeddy, Srikar A, MD  Multiple Vitamin (MULTIVITAMIN WITH MINERALS) TABS Take 1 tablet by mouth daily. Patient not taking: Reported on 03/14/2019 03/12/12   Cristal Fordeddy, Srikar A, MD  thiamine 100 MG tablet Take 1 tablet (100 mg total) by mouth daily. Patient not taking: Reported on 03/10/2019 03/12/12   Cristal Fordeddy, Srikar A, MD     Allergies:     Allergies  Allergen Reactions  . Aspirin Anaphylaxis  . Nsaids Anaphylaxis  . Rhubarb   . Strawberry Extract   . Tylenol [Acetaminophen]      Physical Exam:   Vitals  Blood pressure  133/76, pulse (!) 111, temperature 97.6 F (36.4 C), temperature source Oral, resp. rate (!) 32, SpO2 100 %.  Physical Examination: General appearance - alert, poorly kept Mental status - alert, oriented to person, place, and time,  Eyes - sclera anicteric Neck - supple,  Chest -diminished bilaterally, no wheezing  heart - S1 and S2 normal, irregularly irregular and tachycardic  Abdomen - soft, nontender, nondistended, no masses or organomegaly Neurological - screening mental status exam normal, neck supple without rigidity -Patient with generalized weakness left more than right especially left upper extremity -Extremities - 2+ pedal edema noted, intact peripheral pulses  Skin -multiple excoriated areas with abrasions, no secondary superimposed cellulitis noted    Data Review:    CBC Recent Labs  Lab 03/07/19 1135  WBC 26.6*  HGB 8.6*  HCT 26.6*  PLT 37*  MCV 113.7*  MCH 36.8*  MCHC 32.3  RDW 14.9   ------------------------------------------------------------------------------------------------------------------  Chemistries  Recent Labs  Lab 2019/03/07 1135 07-Mar-2019 1148  NA 129*  --   K 4.1  --   CL 96*  --   CO2 20*  --   GLUCOSE 421*  --   BUN 49*  --   CREATININE 2.13*  --   CALCIUM 7.5*  --   AST  --  35  ALT  --  19  ALKPHOS  --  137*  BILITOT  --  4.6*   ------------------------------------------------------------------------------------------------------------------ CrCl cannot be calculated (Unknown ideal weight.). ------------------------------------------------------------------------------------------------------------------ No results for input(s): TSH, T4TOTAL, T3FREE, THYROIDAB in the last 72 hours.  Invalid input(s): FREET3   Coagulation profile No results for input(s): INR, PROTIME in the last 168 hours. ------------------------------------------------------------------------------------------------------------------- No results for  input(s): DDIMER in the last 72 hours. -------------------------------------------------------------------------------------------------------------------  Cardiac Enzymes No results for input(s): CKMB, TROPONINI, MYOGLOBIN in the last 168 hours.  Invalid input(s): CK ------------------------------------------------------------------------------------------------------------------    Component Value Date/Time   BNP 544.0 (H) March 07, 2019 1135   Urinalysis    Component Value Date/Time   COLORURINE YELLOW 03/11/2012 1301   APPEARANCEUR CLEAR 03/11/2012 1301   LABSPEC 1.017 03/11/2012 1301   PHURINE 6.0 03/11/2012 1301   GLUCOSEU 250 (A) 03/11/2012 1301   HGBUR NEGATIVE 03/11/2012 1301   BILIRUBINUR NEGATIVE 03/11/2012 1301   KETONESUR 40 (A) 03/11/2012 1301   PROTEINUR NEGATIVE 03/11/2012 1301   UROBILINOGEN 0.2 03/11/2012 1301   NITRITE NEGATIVE 03/11/2012 1301   LEUKOCYTESUR NEGATIVE 03/11/2012 1301     Imaging Results:    Dg Chest Portable 1 View  Result Date: 03-07-19 CLINICAL DATA:  Shortness of breath and hyperglycemia for several days. EXAM: PORTABLE CHEST 1 VIEW COMPARISON:  PA and lateral chest 03/11/2012. FINDINGS: The lungs are clear. Heart size is normal. Aortic atherosclerosis noted. No pneumothorax or pleural fluid. No acute or focal bony abnormality. IMPRESSION: No acute disease. Atherosclerosis. Electronically Signed   By: Drusilla Kanner M.D.   On: March 07, 2019 11:56    Radiological Exams on Admission: Dg Chest Portable 1 View  Result Date: 03/07/2019 CLINICAL DATA:  Shortness of breath and hyperglycemia for several days. EXAM: PORTABLE CHEST 1 VIEW COMPARISON:  PA and lateral chest 03/11/2012. FINDINGS: The lungs are clear. Heart size is normal. Aortic atherosclerosis noted. No pneumothorax or pleural fluid. No acute or focal bony abnormality. IMPRESSION: No acute disease. Atherosclerosis. Electronically Signed   By: Drusilla Kanner M.D.   On: 03/07/2019 11:56    DVT Prophylaxis -SCD--thrombocytopenia AM Labs Ordered, also please review Full Orders  Family Communication: Admission, patients condition and plan of care including tests being ordered have been discussed with the patient  who indicate understanding and agree with the plan   Code Status - Full Code  Likely DC to home   Condition  -stable  Shon Hale M.D on 03-07-19 at 5:31 PM Go to [email or web address] -  for contact info  [facility] - Office  [phone]

## 2019-02-22 NOTE — ED Triage Notes (Signed)
Pt called out for breathing problems. SOB with exertion. Pt has not been taking meds for diabetes for years CBG 501. Pt in Afib rate 110-150. Pt is weak. Pt lives in [place] behind friends house. Noted to have multiple bruises on body. Pt having difficulty ambulating. Pitting edema in all 2 extremities

## 2019-02-23 ENCOUNTER — Observation Stay (HOSPITAL_COMMUNITY): Payer: Medicaid Other

## 2019-02-23 ENCOUNTER — Observation Stay (HOSPITAL_BASED_OUTPATIENT_CLINIC_OR_DEPARTMENT_OTHER): Payer: Medicaid Other

## 2019-02-23 DIAGNOSIS — A411 Sepsis due to other specified staphylococcus: Secondary | ICD-10-CM | POA: Diagnosis present

## 2019-02-23 DIAGNOSIS — I639 Cerebral infarction, unspecified: Secondary | ICD-10-CM | POA: Diagnosis present

## 2019-02-23 DIAGNOSIS — N390 Urinary tract infection, site not specified: Secondary | ICD-10-CM | POA: Diagnosis present

## 2019-02-23 DIAGNOSIS — I5033 Acute on chronic diastolic (congestive) heart failure: Secondary | ICD-10-CM | POA: Diagnosis present

## 2019-02-23 DIAGNOSIS — I330 Acute and subacute infective endocarditis: Secondary | ICD-10-CM | POA: Diagnosis present

## 2019-02-23 DIAGNOSIS — I130 Hypertensive heart and chronic kidney disease with heart failure and stage 1 through stage 4 chronic kidney disease, or unspecified chronic kidney disease: Secondary | ICD-10-CM | POA: Diagnosis present

## 2019-02-23 DIAGNOSIS — Z20828 Contact with and (suspected) exposure to other viral communicable diseases: Secondary | ICD-10-CM | POA: Diagnosis present

## 2019-02-23 DIAGNOSIS — F1010 Alcohol abuse, uncomplicated: Secondary | ICD-10-CM | POA: Diagnosis present

## 2019-02-23 DIAGNOSIS — I361 Nonrheumatic tricuspid (valve) insufficiency: Secondary | ICD-10-CM

## 2019-02-23 DIAGNOSIS — Z515 Encounter for palliative care: Secondary | ICD-10-CM | POA: Diagnosis not present

## 2019-02-23 DIAGNOSIS — K7031 Alcoholic cirrhosis of liver with ascites: Secondary | ICD-10-CM | POA: Diagnosis present

## 2019-02-23 DIAGNOSIS — Z7984 Long term (current) use of oral hypoglycemic drugs: Secondary | ICD-10-CM | POA: Diagnosis not present

## 2019-02-23 DIAGNOSIS — I079 Rheumatic tricuspid valve disease, unspecified: Secondary | ICD-10-CM | POA: Diagnosis present

## 2019-02-23 DIAGNOSIS — I351 Nonrheumatic aortic (valve) insufficiency: Secondary | ICD-10-CM

## 2019-02-23 DIAGNOSIS — Z9114 Patient's other noncompliance with medication regimen: Secondary | ICD-10-CM | POA: Diagnosis not present

## 2019-02-23 DIAGNOSIS — G9349 Other encephalopathy: Secondary | ICD-10-CM | POA: Diagnosis not present

## 2019-02-23 DIAGNOSIS — R6521 Severe sepsis with septic shock: Secondary | ICD-10-CM | POA: Diagnosis not present

## 2019-02-23 DIAGNOSIS — N179 Acute kidney failure, unspecified: Secondary | ICD-10-CM | POA: Diagnosis present

## 2019-02-23 DIAGNOSIS — Z8249 Family history of ischemic heart disease and other diseases of the circulatory system: Secondary | ICD-10-CM | POA: Diagnosis not present

## 2019-02-23 DIAGNOSIS — I38 Endocarditis, valve unspecified: Secondary | ICD-10-CM | POA: Diagnosis present

## 2019-02-23 DIAGNOSIS — D65 Disseminated intravascular coagulation [defibrination syndrome]: Secondary | ICD-10-CM | POA: Diagnosis present

## 2019-02-23 DIAGNOSIS — J189 Pneumonia, unspecified organism: Secondary | ICD-10-CM | POA: Diagnosis not present

## 2019-02-23 DIAGNOSIS — Z66 Do not resuscitate: Secondary | ICD-10-CM | POA: Diagnosis not present

## 2019-02-23 DIAGNOSIS — I482 Chronic atrial fibrillation, unspecified: Secondary | ICD-10-CM

## 2019-02-23 DIAGNOSIS — E872 Acidosis: Secondary | ICD-10-CM | POA: Diagnosis not present

## 2019-02-23 DIAGNOSIS — I9589 Other hypotension: Secondary | ICD-10-CM | POA: Diagnosis present

## 2019-02-23 DIAGNOSIS — E871 Hypo-osmolality and hyponatremia: Secondary | ICD-10-CM | POA: Diagnosis not present

## 2019-02-23 DIAGNOSIS — Z800 Family history of malignant neoplasm of digestive organs: Secondary | ICD-10-CM | POA: Diagnosis not present

## 2019-02-23 DIAGNOSIS — I6389 Other cerebral infarction: Secondary | ICD-10-CM

## 2019-02-23 DIAGNOSIS — A4189 Other specified sepsis: Secondary | ICD-10-CM

## 2019-02-23 LAB — ECHOCARDIOGRAM COMPLETE
Height: 73 in
Weight: 3301.61 oz

## 2019-02-23 LAB — BLOOD CULTURE ID PANEL (REFLEXED)

## 2019-02-23 LAB — BASIC METABOLIC PANEL
Anion gap: 12 (ref 5–15)
BUN: 52 mg/dL — ABNORMAL HIGH (ref 8–23)
CO2: 18 mmol/L — ABNORMAL LOW (ref 22–32)
Calcium: 7.3 mg/dL — ABNORMAL LOW (ref 8.9–10.3)
Chloride: 100 mmol/L (ref 98–111)
Creatinine, Ser: 2.19 mg/dL — ABNORMAL HIGH (ref 0.61–1.24)
GFR calc Af Amer: 35 mL/min — ABNORMAL LOW (ref >60)
GFR calc non Af Amer: 30 mL/min — ABNORMAL LOW (ref >60)
Glucose, Bld: 231 mg/dL — ABNORMAL HIGH (ref 70–99)
Potassium: 3.7 mmol/L (ref 3.5–5.1)
Sodium: 130 mmol/L — ABNORMAL LOW (ref 135–145)

## 2019-02-23 LAB — CBC
HCT: 23.6 % — ABNORMAL LOW (ref 39.0–52.0)
Hemoglobin: 7.6 g/dL — ABNORMAL LOW (ref 13.0–17.0)
MCH: 36.5 pg — ABNORMAL HIGH (ref 26.0–34.0)
MCHC: 32.2 g/dL (ref 30.0–36.0)
MCV: 113.5 fL — ABNORMAL HIGH (ref 80.0–100.0)
Platelets: 34 10*3/uL — ABNORMAL LOW (ref 150–400)
RBC: 2.08 MIL/uL — ABNORMAL LOW (ref 4.22–5.81)
RDW: 14.9 % (ref 11.5–15.5)
WBC: 28.4 10*3/uL — ABNORMAL HIGH (ref 4.0–10.5)
nRBC: 0 % (ref 0.0–0.2)

## 2019-02-23 LAB — PROTIME-INR
INR: 1.8 — ABNORMAL HIGH (ref 0.8–1.2)
Prothrombin Time: 20.2 seconds — ABNORMAL HIGH (ref 11.4–15.2)

## 2019-02-23 LAB — GLUCOSE, CAPILLARY
Glucose-Capillary: 232 mg/dL — ABNORMAL HIGH (ref 70–99)
Glucose-Capillary: 206 mg/dL — ABNORMAL HIGH (ref 70–99)
Glucose-Capillary: 222 mg/dL — ABNORMAL HIGH (ref 70–99)
Glucose-Capillary: 185 mg/dL — ABNORMAL HIGH (ref 70–99)

## 2019-02-23 LAB — HIV ANTIBODY (ROUTINE TESTING W REFLEX): HIV Screen 4th Generation wRfx: REACTIVE — AB

## 2019-02-23 MED ORDER — LEVETIRACETAM IN NACL 500 MG/100ML IV SOLN
500.0000 mg | Freq: Two times a day (BID) | INTRAVENOUS | Status: DC
  Administered 2019-02-24 – 2019-02-25 (×3): 500 mg via INTRAVENOUS
  Filled 2019-02-23 (×2): qty 100

## 2019-02-23 MED ORDER — VANCOMYCIN HCL 1.25 G IV SOLR
1250.0000 mg | INTRAVENOUS | Status: DC
  Filled 2019-02-23 (×2): qty 1250

## 2019-02-23 MED ORDER — LEVETIRACETAM IN NACL 1000 MG/100ML IV SOLN
1000.0000 mg | Freq: Once | INTRAVENOUS | Status: AC
  Administered 2019-02-23: 1000 mg via INTRAVENOUS
  Filled 2019-02-23: qty 100

## 2019-02-23 MED ORDER — ATORVASTATIN CALCIUM 80 MG PO TABS
80.0000 mg | ORAL_TABLET | Freq: Every day | ORAL | Status: DC
  Administered 2019-02-26: 80 mg via ORAL
  Filled 2019-02-23: qty 1
  Filled 2019-02-23: qty 2

## 2019-02-23 MED ORDER — SODIUM CHLORIDE 0.9 % IV SOLN: 2.0000 g | INTRAVENOUS | Status: DC

## 2019-02-23 MED ORDER — VANCOMYCIN HCL IN DEXTROSE 1-5 GM/200ML-% IV SOLN: 1000.0000 mg | Freq: Once | INTRAVENOUS | Status: DC

## 2019-02-23 MED ORDER — VANCOMYCIN HCL IN DEXTROSE 1-5 GM/200ML-% IV SOLN
1000.0000 mg | INTRAVENOUS | Status: AC
  Administered 2019-02-23 (×2): 1000 mg via INTRAVENOUS
  Filled 2019-02-23 (×2): qty 200

## 2019-02-23 MED ORDER — SODIUM CHLORIDE 0.9 % IV SOLN
1.0000 g | Freq: Once | INTRAVENOUS | Status: AC
  Administered 2019-02-23: 1 g via INTRAVENOUS
  Filled 2019-02-23: qty 10

## 2019-02-23 MED ORDER — SODIUM CHLORIDE 0.9 % IV SOLN
2.0000 g | INTRAVENOUS | Status: DC
  Administered 2019-02-24 – 2019-02-25 (×2): 2 g via INTRAVENOUS
  Filled 2019-02-23 (×2): qty 20

## 2019-02-23 MED ORDER — MAGNESIUM SULFATE 2 GM/50ML IV SOLN
2.0000 g | Freq: Once | INTRAVENOUS | Status: AC
  Administered 2019-02-23: 2 g via INTRAVENOUS
  Filled 2019-02-23: qty 50

## 2019-02-23 MED ORDER — SODIUM CHLORIDE 0.9 % IV SOLN
1.0000 g | INTRAVENOUS | Status: DC
  Administered 2019-02-23: 1 g via INTRAVENOUS
  Filled 2019-02-23: qty 10

## 2019-02-23 MED ORDER — METOPROLOL TARTRATE 25 MG PO TABS
25.0000 mg | ORAL_TABLET | Freq: Two times a day (BID) | ORAL | Status: DC
  Administered 2019-02-23 – 2019-02-24 (×3): 25 mg via ORAL
  Filled 2019-02-23 (×4): qty 1

## 2019-02-23 NOTE — Progress Notes (Signed)
Pharmacy Antibiotic Note  Luke Garcia is a 68 y.o. 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  Pharmacy has been consulted for Vancomycin dosing.  Plan: Vancomycin 2000 mg IV x 1 dose. Vancomycin 1250 mg IV every 24 hours.  Goal trough 15-20 mcg/mL.  Ceftriaxone 2000 mg IV every 24 hours. Monitor labs, c/s, and vanco level as indicated.  Height: 6\' 1"  (185.4 cm) Weight: 206 lb 5.6 oz (93.6 kg) IBW/kg (Calculated) : 79.9  Temp (24hrs), Avg:98.3 F (36.8 C), Min:97.6 F (36.4 C), Max:99.2 F (37.3 C)  Recent Labs  Lab 02/26/2019 1135 02/23/19 0425  WBC 26.6* 28.4*  CREATININE 2.13* 2.19*    Estimated Creatinine Clearance: 37 mL/min (A) (by C-G formula based on SCr of 2.19 mg/dL (H)).    Allergies  Allergen Reactions  . Aspirin Anaphylaxis  . Nsaids Anaphylaxis  . Rhubarb   . Strawberry Extract   . Tylenol [Acetaminophen]     Antimicrobials this admission: Vanco 11/10 >>  CTX 11/10 >>   Dose adjustments this admission: Vanco  Microbiology results: 11/10 BCx: [place] 11/10 MRSA PCR: neg  Thank you for allowing pharmacy to be a part of this patient's care.  Ramond Craver 02/23/2019 2:39 PM

## 2019-02-23 NOTE — Progress Notes (Signed)
*  PRELIMINARY RESULTS* Echocardiogram 2D Echocardiogram has been performed.  Luke Garcia 02/23/2019, 12:32 PM

## 2019-02-23 NOTE — Progress Notes (Addendum)
Patient Demographics:    Luke Garcia, is a 68 y.o. 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 Diffuse atherosclerotic irregularity of the left P2 PCA with  suspected high-grade stenosis in the P2/P3 junction. -Chronic right parietal infarct. Mild chronic microvascular ischemic changes. -No large vessel occlusion or hemodynamically significant stenosis in the neck. Intracranial atherosclerotic changes.  Echocardiogram/TTE -Tricuspid Valve: The tricuspid valve is abnormal. Tricuspid valve regurgitation is mild. There is a large partially mobile vegetation attachted to the anterior leaflet of the tricuspid valve. Measures 1.8 x 2.4 cm.   A/p 1)GPC Endocarditis of Tricuspid Valve--echocardiogram with tricuspid vegetation as noted-- discussed with Dr. Enedina FinnerJeff Hatcher from infectious disease--treat empirically with IV vancomycin pending blood culture identification and  sensitivity, repeat blood cultures on 02/24/2019. -Will most likely need 6 weeks of IV antibiotics from first negative cultures -WBCs 28.4 - 2)Acute Cerebellar infarct--- not a candidate for antiplatelet agent due to severe thrombocytopenia and anemia,  -INR is  1.8 due to liver dysfunction/cirrhosis -speech pathologist recommends- Dysphagia 2 (Fine chop);Honey-thick liquid  -Physical therapy evaluation appreciated -Continue Lipitor  3)AFib--- --review of records reveals longstanding A. Fib -CHA2DS2- VASc score   is = 3 (Age, HTN and DM)   Which is  equal to = 3.2  % annual risk of stroke  -However patient is not a candidate for anticoagulation due to patient platelet count of 34 and hemoglobin of 7.6 --INR is  1.8 due to liver dysfunction/cirrhosis -Magnesium was 1.2,  TSH was 4.8 -Echocardiogram with EF of 60 to 65%, LVH, rather large anterior leaflet tricuspid valve partially mobile vegetation measuring 1.8 x 2.4 cm consistent with endocarditis -Okay to stop IV Cardizem drip  -metoprolol 25 mg twice daily as BP allows for rate control  4)DM2-- blood sugar over 500 on presentation, check A1c -Do not restart metformin due to kidney function, Use Novolog/Humalog Sliding scale insulin with Accu-Cheks/Fingersticks as ordered  5)UTI--empiric treatment with IV Rocephin pending urine and blood culture sensitivity and intensification  6)HFpEF--history of chronic dCHF-- -patient with edema and shortness of breath -Suspect worsening CHF due to A. 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 -Stool occult blood pending  8)Social/Ethics--patient is a full code, Pt lives in camper behind friends house. Noted to have multiple bruises on body--- he is not compliant with medications --Social work consult requested Discussed with pts listed NOK--- Mr. Freida Busman Spring [phone] is actually currently in [place] Pt's son Mr. Foch Rosenwald--- [phone]-- Left Message Discussed with Brother-Ted Blinn---[phone]  9)HIV --- reactive/positive--- hold off on treatment to avoid reconstitution syndrome. -Patient will need outpatient follow-up with infectious disease  10)AKI----Versus acute kidney injury on CKD stage -stage unknown    creatinine on admission= 2.13 ,   baseline creatinine = unkn   , creatinine is now=2.19    , renally adjust medications, avoid nephrotoxic agents/dehydration/hypotension   Disposition/Need for in-Hospital Stay- patient unable to be discharged at this time due to --endocarditis and sepsis requiring IV antibiotics pending culture results  Code Status : Full Code  Family Communication:    -Discussed with pts listed NOK--- Mr. Freida Busman spring [phone])--who is actually currently in [place] Pt's son Mr. Jaquel Glassburn--- [phone]--- Left Voicemail- Discussed with Brother-Ted Larrivee---[phone]  Disposition Plan  : SNF   Consults  : Phone consult with Dr. Enedina Finner from infectious disease --Cardiology consult from Dr. Wyline Mood appreciated Neurology Consult --Dr Gerilyn Pilgrim  DVT Prophylaxis  :   - SCDs (anemia and thrombocytopenia)  Lab Results  Component Value Date   PLT 34 (L) 02/23/2019    Inpatient Medications  Scheduled Meds:  atorvastatin  80 mg Oral q1800   carvedilol  3.125 mg Oral BID WC   Chlorhexidine Gluconate Cloth  6 each Topical Daily   folic acid  1 mg Oral Daily   furosemide  20 mg Intravenous Q12H   glipiZIDE  2.5 mg Oral BID AC   insulin  aspart  0-5 Units Subcutaneous QHS   insulin aspart  0-9 Units Subcutaneous TID WC   multivitamin with minerals  1 tablet Oral Daily   sodium chloride flush  3 mL Intravenous Q12H   thiamine  100 mg Oral Daily   Continuous Infusions:  sodium chloride     cefTRIAXone (ROCEPHIN)  IV     [START ON 02/24/2019] cefTRIAXone (ROCEPHIN)  IV     diltiazem (CARDIZEM) infusion 2.5 mg/hr (02/23/19 0900)   PRN Meds:.sodium chloride, acetaminophen **OR** acetaminophen, albuterol, LORazepam, ondansetron **OR** ondansetron (ZOFRAN) IV, polyethylene glycol, sodium chloride flush, traZODone    Anti-infectives (From admission, onward)   Start     Dose/Rate Route Frequency Ordered Stop   02/24/19 1115  cefTRIAXone (ROCEPHIN) 2 g in sodium chloride 0.9 % 100 mL IVPB  Status:  Discontinued     2 g 200 mL/hr over 30 Minutes Intravenous Every 24 hours 02/23/19 1317 02/23/19 1317   02/24/19 1115  cefTRIAXone (ROCEPHIN) 2 g in sodium chloride 0.9 % 100 mL IVPB     2 g 200 mL/hr over 30 Minutes Intravenous Every 24 hours 02/23/19 1317     02/23/19 1400  cefTRIAXone (ROCEPHIN) 1 g in sodium chloride 0.9 % 100 mL IVPB     1 g 200 mL/hr over 30 Minutes Intravenous  Once 02/23/19 1356  02/23/19 1115  cefTRIAXone (ROCEPHIN) 1 g in sodium chloride 0.9 % 100 mL IVPB  Status:  Discontinued     1 g 200 mL/hr over 30 Minutes Intravenous Every 24 hours 02/23/19 1112 02/23/19 1317        Objective:   Vitals:   02/23/19 1120 02/23/19 1130 02/23/19 1200 02/23/19 1300  BP: (!) 110/57 99/64 (!) 99/59 (!) 114/59  Pulse: 75 (!) 101 (!) 111 72  Resp: (!) 26 (!) 23 (!) 22 (!) 24  Temp:  97.9 F (36.6 C)    TempSrc:  Oral    SpO2: 100% 100% 99% 99%  Weight:      Height:        Wt Readings from Last 3 Encounters:  02/23/19 93.6 kg  03/12/12 109.4 kg     Intake/Output Summary (Last 24 hours) at 02/23/2019 1403 Last data filed at 02/23/2019 1000 Gross per 24 hour  Intake 589.52 ml  Output 425 ml    Net 164.52 ml   Physical Exam  Physical Examination: General appearance -  poorly kept, chronically ill-appearing, lethargic Mental status -lethargic and disoriented Neck - supple,  Chest -diminished bilaterally, bibasilar rales,    heart - S1 and S2 normal, 3/6 SM irregularly irregular and tachycardic Abdomen - soft, nontender, nondistended,  Neurological - screening mental status exam normal, neck supple without rigidity -Patient with generalized weakness left more than right especially left upper extremity -Extremities - 2+ pedal edema noted, intact peripheral pulses  Skin -multiple excoriated areas with abrasions and ecchymosis, no secondary superimposed cellulitis noted   Data Review:   Micro Results Recent Results (from the past 240 hour(s))  SARS CORONAVIRUS 2 (TAT 6-24 HRS) Nasopharyngeal Nasopharyngeal Swab     Status: None   Collection Time: 03/04/2019 11:43 AM   Specimen: Nasopharyngeal Swab  Result Value Ref Range Status   SARS Coronavirus 2 NEGATIVE NEGATIVE Final    Comment: (NOTE) SARS-CoV-2 target nucleic acids are NOT DETECTED. The SARS-CoV-2 RNA is generally detectable in upper and lower respiratory specimens during the acute phase of infection. Negative results do not preclude SARS-CoV-2 infection, do not rule out co-infections with other pathogens, and should not be used as the sole basis for treatment or other patient management decisions. Negative results must be combined with clinical observations, patient history, and epidemiological information. The expected result is Negative. Fact Sheet for Patients: SugarRoll.be Fact Sheet for Healthcare Providers: https://www.woods-mathews.com/ This test is not yet approved or cleared by the [place] FDA and  has been authorized for detection and/or diagnosis of SARS-CoV-2 by FDA under an Emergency Use Authorization (EUA). This EUA will remain  in effect (meaning this  test can be used) for the duration of the COVID-19 declaration under Section 56 4(b)(1) of the Act, 21 U.S.C. section 360bbb-3(b)(1), unless the authorization is terminated or revoked sooner. Performed at [facility], [place] [address] [place], [place] [address]   MRSA PCR Screening     Status: None   Collection Time: 03/14/2019  4:04 PM   Specimen: Nasal Mucosa; Nasopharyngeal  Result Value Ref Range Status   MRSA by PCR NEGATIVE NEGATIVE Final    Comment:        The GeneXpert MRSA Assay (FDA approved for NASAL specimens only), is one component of a comprehensive MRSA colonization surveillance program. It is not intended to diagnose MRSA infection nor to guide or monitor treatment for MRSA infections. Performed at [facility], [address] [place],  [address]  Culture, blood (Routine X 2) w Reflex to ID Panel     Status: None (Preliminary result)   Collection Time: 02/24/2019  9:03 PM   Specimen: BLOOD  Result Value Ref Range Status   Specimen Description BLOOD RIGHT ANTECUBITAL  Final   Special Requests   Final    BOTTLES DRAWN AEROBIC AND ANAEROBIC Blood Culture adequate volume   Culture  Setup Time   Final    GRAM POSITIVE COCCI ANAEROBIC BOTTLE ONLY PREVIOUSLY CALLED OTHER SET AT 1108A ON 111020 TO HYLTON L. BY THOMPSON S. Performed at [facility], [address], [place], [place] [address]    Culture PENDING  Incomplete   Report Status PENDING  Incomplete  Culture, blood (Routine X 2) w Reflex to ID Panel     Status: None (Preliminary result)   Collection Time: 03/10/2019  9:08 PM   Specimen: BLOOD RIGHT HAND  Result Value Ref Range Status   Specimen Description BLOOD RIGHT HAND  Final   Special Requests   Final    BOTTLES DRAWN AEROBIC AND ANAEROBIC Blood Culture adequate volume   Culture  Setup Time   Final    GRAM POSITIVE COCCI ANAEROBIC BOTTLE ONLY Gram Stain Report Called to,Read Back By and Verified With: HYLTON L. AT 1108A ON 111020 BY  THOMPSON S. Performed at [facility], [address] [place], [place] [address]    Culture PENDING  Incomplete   Report Status PENDING  Incomplete    Radiology Reports Ct Head Wo Contrast  Result Date: 03/05/2019 CLINICAL DATA:  Weakness and shortness of breath EXAM: CT HEAD WITHOUT CONTRAST TECHNIQUE: Contiguous axial images were obtained from the base of the skull through the vertex without intravenous contrast. COMPARISON:  None. FINDINGS: Brain: There is no mass, hemorrhage or extra-axial collection. There is generalized atrophy without lobar predilection. Hypodensity of the white matter is most commonly associated with chronic microvascular disease. There is an old right parieto-occipital infarct. Vascular: Atherosclerotic calcification of the vertebral and internal carotid arteries at the skull base. No abnormal hyperdensity of the major intracranial arteries or dural venous sinuses. Skull: The visualized skull base, calvarium and extracranial soft tissues are normal. Sinuses/Orbits: No fluid levels or advanced mucosal thickening of the visualized paranasal sinuses. No mastoid or middle ear effusion. The orbits are normal. IMPRESSION: 1. No acute intracranial abnormality. 2. 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 Electronically Signed   By: Deatra Robinson M.D.   On: 02/23/2019 22:20   Mr Angio Head Wo Contrast  Result Date: 02/23/2019 CLINICAL DATA:  Speech difficulty EXAM: MRI HEAD WITHOUT CONTRAST MRA HEAD WITHOUT CONTRAST MRA NECK WITHOUT CONTRAST TECHNIQUE: Multiplanar, multiecho pulse sequences of the brain and surrounding structures were obtained without intravenous contrast. Angiographic images of the Circle of Willis were obtained using MRA technique without intravenous contrast. Angiographic images of the neck were obtained using MRA technique without intravenous contrast. Carotid stenosis measurements (when applicable) are obtained utilizing  NASCET criteria, using the distal internal carotid diameter as the denominator. COMPARISON:  None. FINDINGS: MRI HEAD FINDINGS Motion artifact is present. Brain: There is no acute infarction or intracranial hemorrhage. Chronic right parietal infarct is present. Prominence of ventricles and sulci reflects generalized parenchymal volume loss. Additional patchy T2 hyperintensity in the supratentorial white matter is nonspecific but may reflect mild chronic microvascular ischemic changes. There is no intracranial mass, mass effect, or extra-axial fluid collection. Vascular: Major vessel flow voids at the skull base are preserved. Skull and upper  cervical spine: Marrow signal is within normal limits. Sinuses/Orbits: Minor mucosal thickening.  Orbits are unremarkable. Other: None. MRA HEAD FINDINGS Motion artifact is present. Intracranial internal carotid arteries, middle cerebral arteries, and anterior cerebral arteries are patent. Mild multifocal atherosclerotic irregularity is present. Intracranial vertebral, basilar, and posterior cerebral arteries are patent. There is moderate to severe stenosis of the proximal right P2 PCA. Diffuse atherosclerotic irregularity of the left P2 PCA with suspected high-grade stenosis in the P2/P3 junction. MRA NECK FINDINGS Included common carotid arteries are patent. Bilateral internal and external carotid arteries are patent. There is mild atherosclerotic irregularity at the ICA origins without measurable stenosis. Extracranial vertebral arteries are patent. IMPRESSION: Suboptimal evaluation due to motion artifact. No acute infarction, hemorrhage, or mass. Chronic right parietal infarct. Mild chronic microvascular ischemic changes. No large vessel occlusion or hemodynamically significant stenosis in the neck. Intracranial atherosclerotic changes. Electronically Signed   By: Guadlupe Spanish M.D.   On: 02/23/2019 12:18   Mr Angio Neck Wo Contrast  Result Date: 02/23/2019 CLINICAL  DATA:  Speech difficulty EXAM: MRI HEAD WITHOUT CONTRAST MRA HEAD WITHOUT CONTRAST MRA NECK WITHOUT CONTRAST TECHNIQUE: Multiplanar, multiecho pulse sequences of the brain and surrounding structures were obtained without intravenous contrast. Angiographic images of the Circle of Willis were obtained using MRA technique without intravenous contrast. Angiographic images of the neck were obtained using MRA technique without intravenous contrast. Carotid stenosis measurements (when applicable) are obtained utilizing NASCET criteria, using the distal internal carotid diameter as the denominator. COMPARISON:  None. FINDINGS: MRI HEAD FINDINGS Motion artifact is present. Brain: There is no acute infarction or intracranial hemorrhage. Chronic right parietal infarct is present. Prominence of ventricles and sulci reflects generalized parenchymal volume loss. Additional patchy T2 hyperintensity in the supratentorial white matter is nonspecific but may reflect mild chronic microvascular ischemic changes. There is no intracranial mass, mass effect, or extra-axial fluid collection. Vascular: Major vessel flow voids at the skull base are preserved. Skull and upper cervical spine: Marrow signal is within normal limits. Sinuses/Orbits: Minor mucosal thickening.  Orbits are unremarkable. Other: None. MRA HEAD FINDINGS Motion artifact is present. Intracranial internal carotid arteries, middle cerebral arteries, and anterior cerebral arteries are patent. Mild multifocal atherosclerotic irregularity is present. Intracranial vertebral, basilar, and posterior cerebral arteries are patent. There is moderate to severe stenosis of the proximal right P2 PCA. Diffuse atherosclerotic irregularity of the left P2 PCA with suspected high-grade stenosis in the P2/P3 junction. MRA NECK FINDINGS Included common carotid arteries are patent. Bilateral internal and external carotid arteries are patent. There is mild atherosclerotic irregularity at the  ICA origins without measurable stenosis. Extracranial vertebral arteries are patent. IMPRESSION: Suboptimal evaluation due to motion artifact. No acute infarction, hemorrhage, or mass. Chronic right parietal infarct. Mild chronic microvascular ischemic changes. No large vessel occlusion or hemodynamically significant stenosis in the neck. Intracranial atherosclerotic changes. Electronically Signed   By: Guadlupe Spanish M.D.   On: 02/23/2019 12:18   Mr Brain Wo Contrast  Result Date: 02/23/2019 CLINICAL DATA:  Speech difficulty EXAM: MRI HEAD WITHOUT CONTRAST MRA HEAD WITHOUT CONTRAST MRA NECK WITHOUT CONTRAST TECHNIQUE: Multiplanar, multiecho pulse sequences of the brain and surrounding structures were obtained without intravenous contrast. Angiographic images of the Circle of Willis were obtained using MRA technique without intravenous contrast. Angiographic images of the neck were obtained using MRA technique without intravenous contrast. Carotid stenosis measurements (when applicable) are obtained utilizing NASCET criteria, using the distal internal carotid diameter as the denominator. COMPARISON:  None. FINDINGS: MRI  HEAD FINDINGS Motion artifact is present. Brain: There is no acute infarction or intracranial hemorrhage. Chronic right parietal infarct is present. Prominence of ventricles and sulci reflects generalized parenchymal volume loss. Additional patchy T2 hyperintensity in the supratentorial white matter is nonspecific but may reflect mild chronic microvascular ischemic changes. There is no intracranial mass, mass effect, or extra-axial fluid collection. Vascular: Major vessel flow voids at the skull base are preserved. Skull and upper cervical spine: Marrow signal is within normal limits. Sinuses/Orbits: Minor mucosal thickening.  Orbits are unremarkable. Other: None. MRA HEAD FINDINGS Motion artifact is present. Intracranial internal carotid arteries, middle cerebral arteries, and anterior cerebral  arteries are patent. Mild multifocal atherosclerotic irregularity is present. Intracranial vertebral, basilar, and posterior cerebral arteries are patent. There is moderate to severe stenosis of the proximal right P2 PCA. Diffuse atherosclerotic irregularity of the left P2 PCA with suspected high-grade stenosis in the P2/P3 junction. MRA NECK FINDINGS Included common carotid arteries are patent. Bilateral internal and external carotid arteries are patent. There is mild atherosclerotic irregularity at the ICA origins without measurable stenosis. Extracranial vertebral arteries are patent. IMPRESSION: Suboptimal evaluation due to motion artifact. No acute infarction, hemorrhage, or mass. Chronic right parietal infarct. Mild chronic microvascular ischemic changes. No large vessel occlusion or hemodynamically significant stenosis in the neck. Intracranial atherosclerotic changes. Electronically Signed   By: Guadlupe Spanish M.D.   On: 02/23/2019 12:18   Dg Chest Portable 1 View  Result Date: 02/24/2019 CLINICAL DATA:  Shortness of breath and hyperglycemia for several days. EXAM: PORTABLE CHEST 1 VIEW COMPARISON:  PA and lateral chest 03/11/2012. FINDINGS: The lungs are clear. Heart size is normal. Aortic atherosclerosis noted. No pneumothorax or pleural fluid. No acute or focal bony abnormality. IMPRESSION: No acute disease. Atherosclerosis. Electronically Signed   By: Drusilla Kanner M.D.   On: 02/21/2019 11:56     CBC Recent Labs  Lab 02/17/2019 1135 02/23/19 0425  WBC 26.6* 28.4*  HGB 8.6* 7.6*  HCT 26.6* 23.6*  PLT 37* 34*  MCV 113.7* 113.5*  MCH 36.8* 36.5*  MCHC 32.3 32.2  RDW 14.9 14.9    Chemistries  Recent Labs  Lab 02/17/2019 1135 02/24/2019 1148 02/21/2019 1729 02/23/19 0425  NA 129*  --   --  130*  K 4.1  --   --  3.7  CL 96*  --   --  100  CO2 20*  --   --  18*  GLUCOSE 421*  --   --  231*  BUN 49*  --   --  52*  CREATININE 2.13*  --   --  2.19*  CALCIUM 7.5*  --   --  7.3*  MG   --   --  1.2*  --   AST  --  35  --   --   ALT  --  19  --   --   ALKPHOS  --  137*  --   --   BILITOT  --  4.6*  --   --    ------------------------------------------------------------------------------------------------------------------ No results for input(s): CHOL, HDL, LDLCALC, TRIG, CHOLHDL, LDLDIRECT in the last 72 hours.  Lab Results  Component Value Date   HGBA1C 6.8 (H) 02/19/2019   ------------------------------------------------------------------------------------------------------------------ Recent Labs    03/02/2019 1730  TSH 4.865*   ------------------------------------------------------------------------------------------------------------------ No results for input(s): VITAMINB12, FOLATE, FERRITIN, TIBC, IRON, RETICCTPCT in the last 72 hours.  Coagulation profile Recent Labs  Lab 02/23/19 1142  INR 1.8*    No results for  input(s): DDIMER in the last 72 hours.  Cardiac Enzymes No results for input(s): CKMB, TROPONINI, MYOGLOBIN in the last 168 hours.  Invalid input(s): CK ------------------------------------------------------------------------------------------------------------------    Component Value Date/Time   BNP 544.0 (H) 02/16/2019 1135     Shon Hale M.D on 02/23/2019 at 2:03 PM  Go to [email or web address] - for contact info  [facility] - Office  [phone]

## 2019-02-23 NOTE — Consult Note (Signed)
[facility] Nerea Bordenave A. Gerilyn Pilgrim, MD     [email or web address]          Luke Garcia is an 68 y.o. male.   ASSESSMENT/PLAN: 1. Acute encephalopathy with unresponsiveness and dysarthria. The etiology is most likely multifactorial. 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 An EEG will be obtained. Given the patient's encephalopathy and prior infarct, I will start the patient on Keppra for now. 2.  New onset Atrial fibrillation: Long-term the patient should be anticoagulated but given the thrombocytopenia, this should be held for now. Consider anticoagulation once platelets are above 100,000. 3.  Remote large vessel right parietal occipital infarct 4. Thrombocytopenia 5. 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    Patient is a 68 year old who presents with dyspnea and the chest discomfort. He was diagnosed as having new onset atrial fibrillation. In the course of the patient's treatment, he was sent to the ICU. Today he was noted to be acutely dysarthric, unresponsive and confused. Acute imaging shows evidence of acute infarct. The patient does have significant thrombocytopenia however. He has had echo which showed evidence of vegetation on the tricuspid valve. Patient seems to be remain confusion unresponsive. He was conversational per the notes on admission. He is quite drowsy and  Minimally responsive during evaluation which limits the review of system.      GENERAL:  The patient is somewhat groggy. He does participate in the evaluation however.  HEENT:   Neck is supple no trauma appreciated.  ABDOMEN: Soft  EXTREMITIES: No edema   BACK: Normal alignment.  SKIN: Normal by inspection.    MENTAL STATUS:  Eyes are partly open but he  does open and close his eyes spontaneously. He does follow midline commands. He does not follow appendicular commands. He says yes and no but otherwise no verbal output.  CRANIAL NERVES: Pupils are equal, round and reactive to light; extraocular movements are full, there is no significant nystagmus; upper and lower facial muscles are normal in strength and symmetric, there is no flattening of the nasolabial folds; tongue is midline  MOTOR:  He has antigravity strength in all 4 extremities. Bulk and tone are normal. Exact strength is not known given the lack of cooperation however.  COORDINATION:  No evidence of dysmetria, myoclonus or tremors. No parkinsonism.  REFLEXES: Deep tendon reflexes are symmetrical and normal.   SENSATION:  He responds normally to painful stimuli.    Blood pressure (!) 100/59, pulse (!) 113, temperature 97.9 F (36.6 C), temperature source Oral, resp. rate (!) 24, height  (1.854 m), weight 93.6 kg, SpO2 98 %.  Past Medical History:  Diagnosis Date  . Atrial fibrillation (HCC)   . Diabetes mellitus without complication (HCC)   . Hypertension     History reviewed. No pertinent surgical history.  Family History  Problem Relation Age of Onset  . Cancer - Other Mother   . Pancreatic cancer Father   . Congestive Heart Failure Father     Social History:  reports that he has never smoked. He has never used smokeless tobacco. He reports current alcohol use. He reports that he does not use drugs.  Allergies:  Allergies  Allergen Reactions  . Aspirin Anaphylaxis  . Nsaids Anaphylaxis  . Rhubarb   . Strawberry Extract   .  Tylenol [Acetaminophen]     Medications: Prior to Admission medications   Medication Sig Start Date End Date Taking? Authorizing Provider  metFORMIN (GLUCOPHAGE) 500 MG tablet Take 1 tablet (500 mg total) by mouth 2 (two) times daily with a meal. Patient taking differently: Take 500 mg by mouth daily as needed.  03/12/12  Yes  Cristal Ford, MD  carvedilol (COREG) 12.5 MG tablet Take 1 tablet (12.5 mg total) by mouth 2 (two) times daily with a meal. Patient not taking: Reported on 02/21/2019 03/12/12   Cristal Ford, MD  folic acid (FOLVITE) 1 MG tablet Take 1 tablet (1 mg total) by mouth daily. Patient not taking: Reported on 03/08/2019 03/12/12   Cristal Ford, MD  glipiZIDE (GLUCOTROL) 2.5 mg TABS Take 0.5 tablets (2.5 mg total) by mouth 2 (two) times daily before a meal. Patient not taking: Reported on 03/09/2019 03/12/12   Cristal Ford, MD  lisinopril (PRINIVIL,ZESTRIL) 5 MG tablet Take 1 tablet (5 mg total) by mouth daily. Patient not taking: Reported on 03/04/2019 03/12/12   Cristal Ford, MD  lovastatin (MEVACOR) 20 MG tablet Take 1 tablet (20 mg total) by mouth at bedtime. Patient not taking: Reported on 03/07/2019 03/12/12   Cristal Ford, MD  Multiple Vitamin (MULTIVITAMIN WITH MINERALS) TABS Take 1 tablet by mouth daily. Patient not taking: Reported on 02/28/2019 03/12/12   Cristal Ford, MD  thiamine 100 MG tablet Take 1 tablet (100 mg total) by mouth daily. Patient not taking: Reported on 03/11/2019 03/12/12   Cristal Ford, MD    Scheduled Meds: . atorvastatin  80 mg Oral q1800  . Chlorhexidine Gluconate Cloth  6 each Topical Daily  . folic acid  1 mg Oral Daily  . furosemide  20 mg Intravenous Q12H  . glipiZIDE  2.5 mg Oral BID AC  . insulin aspart  0-5 Units Subcutaneous QHS  . insulin aspart  0-9 Units Subcutaneous TID WC  . metoprolol tartrate  25 mg Oral BID  . multivitamin with minerals  1 tablet Oral Daily  . sodium chloride flush  3 mL Intravenous Q12H  . thiamine  100 mg Oral Daily   Continuous Infusions: . sodium chloride 250 mL (02/23/19 1722)  . [START ON 02/24/2019] cefTRIAXone (ROCEPHIN)  IV    . magnesium sulfate bolus IVPB    . [START ON 02/24/2019] vancomycin    . vancomycin 1,000 mg (02/23/19 1723)   PRN Meds:.sodium chloride, acetaminophen **OR**  acetaminophen, albuterol, LORazepam, ondansetron **OR** ondansetron (ZOFRAN) IV, polyethylene glycol, sodium chloride flush, traZODone     Results for orders placed or performed during the hospital encounter of 03/02/2019 (from the past 48 hour(s))  CBG monitoring, ED     Status: Abnormal   Collection Time: 03/15/2019 10:55 AM  Result Value Ref Range   Glucose-Capillary 384 (H) 70 - 99 mg/dL  Urinalysis, Routine w reflex microscopic     Status: Abnormal   Collection Time: 02/14/2019 10:58 AM  Result Value Ref Range   Color, Urine AMBER (A) YELLOW    Comment: BIOCHEMICALS MAY BE AFFECTED BY COLOR   APPearance CLEAR CLEAR   Specific Gravity, Urine 1.018 1.005 - 1.030   pH 5.0 5.0 - 8.0   Glucose, UA 150 (A) NEGATIVE mg/dL   Hgb urine dipstick MODERATE (A) NEGATIVE   Bilirubin Urine NEGATIVE NEGATIVE   Ketones, ur NEGATIVE NEGATIVE mg/dL   Protein, ur NEGATIVE NEGATIVE mg/dL   Nitrite POSITIVE (A) NEGATIVE   Leukocytes,Ua  TRACE (A) NEGATIVE   RBC / HPF 0-5 0 - 5 RBC/hpf   WBC, UA 11-20 0 - 5 WBC/hpf   Bacteria, UA NONE SEEN NONE SEEN   Squamous Epithelial / LPF 0-5 0 - 5   Mucus PRESENT    Hyaline Casts, UA PRESENT     Comment: Performed at [facility], [address] [place], [place] [address]  Basic metabolic panel     Status: Abnormal   Collection Time: 03/12/2019 11:35 AM  Result Value Ref Range   Sodium 129 (L) 135 - 145 mmol/L   Potassium 4.1 3.5 - 5.1 mmol/L   Chloride 96 (L) 98 - 111 mmol/L   CO2 20 (L) 22 - 32 mmol/L   Glucose, Bld 421 (H) 70 - 99 mg/dL   BUN 49 (H) 8 - 23 mg/dL   Creatinine, Ser 6.042.13 (H) 0.61 - 1.24 mg/dL   Calcium 7.5 (L) 8.9 - 10.3 mg/dL   GFR calc non Af Amer 31 (L) >60 mL/min   GFR calc Af Amer 36 (L) >60 mL/min   Anion gap 13 5 - 15    Comment: Performed at [facility], [address] [place], [place] [address]  CBC     Status: Abnormal   Collection Time: 03/07/2019 11:35 AM  Result Value Ref Range   WBC 26.6 (H) 4.0 - 10.5 K/uL    Comment:  WHITE COUNT CONFIRMED ON SMEAR   RBC 2.34 (L) 4.22 - 5.81 MIL/uL   Hemoglobin 8.6 (L) 13.0 - 17.0 g/dL   HCT 11.926.6 (L) 14.739.0 - 82.952.0 %   MCV 113.7 (H) 80.0 - 100.0 fL   MCH 36.8 (H) 26.0 - 34.0 pg   MCHC 32.3 30.0 - 36.0 g/dL   RDW 56.214.9 13.011.5 - 86.515.5 %   Platelets 37 (L) 150 - 400 K/uL    Comment: PLATELET COUNT CONFIRMED BY SMEAR SPECIMEN CHECKED FOR CLOTS Immature Platelet Fraction may be clinically indicated, consider ordering this additional test [id number]    nRBC 0.0 0.0 - 0.2 %    Comment: Performed at [facility], [address] [place], [place] [address]  Troponin I (High Sensitivity)     Status: None   Collection Time: 03/10/2019 11:35 AM  Result Value Ref Range   Troponin I (High Sensitivity) 11 <18 ng/L    Comment: (NOTE) Elevated high sensitivity troponin I (hsTnI) values and significant  changes across serial measurements may suggest ACS but many other  chronic and acute conditions are known to elevate hsTnI results.  Refer to the "Links" section for chest pain algorithms and additional  guidance. Performed at [facility], [address] [place], [place] [address]   Brain natriuretic peptide     Status: Abnormal   Collection Time: 02/18/2019 11:35 AM  Result Value Ref Range   B Natriuretic Peptide 544.0 (H) 0.0 - 100.0 pg/mL    Comment: Performed at [facility], [address] [place], [place] [address]  SARS CORONAVIRUS 2 (TAT 6-24 HRS) Nasopharyngeal Nasopharyngeal Swab     Status: None   Collection Time: 02/21/2019 11:43 AM   Specimen: Nasopharyngeal Swab  Result Value Ref Range   SARS Coronavirus 2 NEGATIVE NEGATIVE    Comment: (NOTE) SARS-CoV-2 target nucleic acids are NOT DETECTED. The SARS-CoV-2 RNA is generally detectable in upper and lower respiratory specimens during the acute phase of infection. Negative results do not preclude SARS-CoV-2 infection, do not rule out co-infections with other pathogens, and should not be used as the sole  basis for treatment  or other patient management decisions. Negative results must be combined with clinical observations, patient history, and epidemiological information. The expected result is Negative. Fact Sheet for Patients: HairSlick.no Fact Sheet for Healthcare Providers: quierodirigir.com This test is not yet approved or cleared by the [place] FDA and  has been authorized for detection and/or diagnosis of SARS-CoV-2 by FDA under an Emergency Use Authorization (EUA). This EUA will remain  in effect (meaning this test can be used) for the duration of the COVID-19 declaration under Section 56 4(b)(1) of the Act, 21 U.S.C. section 360bbb-3(b)(1), unless the authorization is terminated or revoked sooner. Performed at [facility], [address] [place], [place] [address]   Hepatic function panel     Status: Abnormal   Collection Time: March 08, 2019 11:48 AM  Result Value Ref Range   Total Protein 7.3 6.5 - 8.1 g/dL   Albumin 1.9 (L) 3.5 - 5.0 g/dL   AST 35 15 - 41 U/L   ALT 19 0 - 44 U/L   Alkaline Phosphatase 137 (H) 38 - 126 U/L   Total Bilirubin 4.6 (H) 0.3 - 1.2 mg/dL   Bilirubin, Direct 1.9 (H) 0.0 - 0.2 mg/dL   Indirect Bilirubin 2.7 (H) 0.3 - 0.9 mg/dL    Comment: Performed at [facility], [address] [place], [place] [address]  Troponin I (High Sensitivity)     Status: None   Collection Time: March 08, 2019  1:24 PM  Result Value Ref Range   Troponin I (High Sensitivity) 12 <18 ng/L    Comment: (NOTE) Elevated high sensitivity troponin I (hsTnI) values and significant  changes across serial measurements may suggest ACS but many other  chronic and acute conditions are known to elevate hsTnI results.  Refer to the "Links" section for chest pain algorithms and additional  guidance. Performed at [facility], [address], [place], [place] [address]   Urine rapid drug screen (hosp performed)     Status: None   Collection Time:  03-08-19  1:51 PM  Result Value Ref Range   Opiates NONE DETECTED NONE DETECTED   Cocaine NONE DETECTED NONE DETECTED   Benzodiazepines NONE DETECTED NONE DETECTED   Amphetamines NONE DETECTED NONE DETECTED   Tetrahydrocannabinol NONE DETECTED NONE DETECTED   Barbiturates NONE DETECTED NONE DETECTED    Comment: (NOTE) DRUG SCREEN FOR MEDICAL PURPOSES ONLY.  IF CONFIRMATION IS NEEDED FOR ANY PURPOSE, NOTIFY LAB WITHIN 5 DAYS. LOWEST DETECTABLE LIMITS FOR URINE DRUG SCREEN Drug Class                     Cutoff (ng/mL) Amphetamine and metabolites    1000 Barbiturate and metabolites    200 Benzodiazepine                 200 Tricyclics and metabolites     300 Opiates and metabolites        300 Cocaine and metabolites        300 THC                            50 Performed at [facility], [address], [place], [place] [address]   CBG monitoring, ED     Status: Abnormal   Collection Time: 2019-03-08  2:29 PM  Result Value Ref Range   Glucose-Capillary 380 (H) 70 - 99 mg/dL  MRSA PCR Screening     Status: None  Collection Time: 03/16/2019  4:04 PM   Specimen: Nasal Mucosa; Nasopharyngeal  Result Value Ref Range   MRSA by PCR NEGATIVE NEGATIVE    Comment:        The GeneXpert MRSA Assay (FDA approved for NASAL specimens only), is one component of a comprehensive MRSA colonization surveillance program. It is not intended to diagnose MRSA infection nor to guide or monitor treatment for MRSA infections. Performed at [facility], [address], [place], [place] [address]   Hemoglobin A1c     Status: Abnormal   Collection Time: 03-16-19  5:27 PM  Result Value Ref Range   Hgb A1c MFr Bld 6.8 (H) 4.8 - 5.6 %    Comment: (NOTE) Pre diabetes:          5.7%-6.4% Diabetes:              >6.4% Glycemic control for   <7.0% adults with diabetes    Mean Plasma Glucose 148.46 mg/dL    Comment: Performed at [facility], [address] [place], [place] [address]  Magnesium      Status: Abnormal   Collection Time: 03/16/19  5:29 PM  Result Value Ref Range   Magnesium 1.2 (L) 1.7 - 2.4 mg/dL    Comment: Performed at [facility], [address], [place], [place] [address]  TSH     Status: Abnormal   Collection Time: 03/16/2019  5:30 PM  Result Value Ref Range   TSH 4.865 (H) 0.350 - 4.500 uIU/mL    Comment: Performed by a 3rd Generation assay with a functional sensitivity of <=0.01 uIU/mL. Performed at [facility], [address] [place], [place] [address]   Glucose, capillary     Status: Abnormal   Collection Time: 16-Mar-2019  5:36 PM  Result Value Ref Range   Glucose-Capillary 377 (H) 70 - 99 mg/dL  Glucose, capillary     Status: Abnormal   Collection Time: 2019/03/16  9:02 PM  Result Value Ref Range   Glucose-Capillary 362 (H) 70 - 99 mg/dL   Comment 1 Notify RN    Comment 2 Document in Chart   Culture, blood (Routine X 2) w Reflex to ID Panel     Status: None (Preliminary result)   Collection Time: Mar 16, 2019  9:03 PM   Specimen: BLOOD  Result Value Ref Range   Specimen Description BLOOD RIGHT ANTECUBITAL    Special Requests      BOTTLES DRAWN AEROBIC AND ANAEROBIC Blood Culture adequate volume   Culture  Setup Time      GRAM POSITIVE COCCI ANAEROBIC BOTTLE ONLY PREVIOUSLY CALLED OTHER SET AT 1108A ON 111020 TO HYLTON L. BY THOMPSON S. Performed at [facility], [address] [place], [place] [address]    Culture PENDING    Report Status PENDING   Culture, blood (Routine X 2) w Reflex to ID Panel     Status: None (Preliminary result)   Collection Time: 03-16-19  9:08 PM   Specimen: BLOOD RIGHT HAND  Result Value Ref Range   Specimen Description      BLOOD RIGHT HAND Performed at [facility] [address], [place], [place] [address]    Special Requests      BOTTLES DRAWN AEROBIC AND ANAEROBIC Blood Culture adequate volume Performed at [facility], [address], [place], [place] [address]    Culture  Setup Time      GRAM POSITIVE COCCI  ANAEROBIC BOTTLE ONLY Gram Stain Report Called to,Read Back By and  Verified With: [place] ON 111020 BY THOMPSON S. Organism ID to follow Performed at [facility], [place] [address] [place], [place] [address]    Culture PENDING    Report Status PENDING   Basic metabolic panel     Status: Abnormal   Collection Time: 02/23/19  4:25 AM  Result Value Ref Range   Sodium 130 (L) 135 - 145 mmol/L   Potassium 3.7 3.5 - 5.1 mmol/L   Chloride 100 98 - 111 mmol/L   CO2 18 (L) 22 - 32 mmol/L   Glucose, Bld 231 (H) 70 - 99 mg/dL   BUN 52 (H) 8 - 23 mg/dL   Creatinine, Ser 2.19 (H) 0.61 - 1.24 mg/dL   Calcium 7.3 (L) 8.9 - 10.3 mg/dL   GFR calc non Af Amer 30 (L) >60 mL/min   GFR calc Af Amer 35 (L) >60 mL/min   Anion gap 12 5 - 15    Comment: Performed at [facility], [address] [place], [place] [address]  CBC     Status: Abnormal   Collection Time: 02/23/19  4:25 AM  Result Value Ref Range   WBC 28.4 (H) 4.0 - 10.5 K/uL   RBC 2.08 (L) 4.22 - 5.81 MIL/uL   Hemoglobin 7.6 (L) 13.0 - 17.0 g/dL   HCT 23.6 (L) 39.0 - 52.0 %   MCV 113.5 (H) 80.0 - 100.0 fL   MCH 36.5 (H) 26.0 - 34.0 pg   MCHC 32.2 30.0 - 36.0 g/dL   RDW 14.9 11.5 - 15.5 %   Platelets 34 (L) 150 - 400 K/uL    Comment: SPECIMEN CHECKED FOR CLOTS Immature Platelet Fraction may be clinically indicated, consider ordering this additional test [id number] CONSISTENT WITH PREVIOUS RESULT    nRBC 0.0 0.0 - 0.2 %    Comment: Performed at [facility], [address] [place], [place] [address]  HIV Antibody (routine testing w rflx)     Status: Abnormal   Collection Time: 02/23/19  4:25 AM  Result Value Ref Range   HIV Screen 4th Generation wRfx Reactive (A) NON REACTIVE    Comment: (NOTE) Reactive result does not distinguish HIV-1 p24 antigen, HIV-1  antibody, HIV-2 antibody, and HIV-1 group O antibody. Results  reactive by HIV Antigen/Antibody EIA must be confirmed. Sent for  confirmation. Performed at [facility], [place] [address], [place], [place] [address]   Glucose, capillary     Status: Abnormal   Collection Time: 02/23/19  7:41 AM  Result Value Ref Range   Glucose-Capillary 232 (H) 70 - 99 mg/dL  Glucose, capillary     Status: Abnormal   Collection Time: 02/23/19 11:25 AM  Result Value Ref Range   Glucose-Capillary 206 (H) 70 - 99 mg/dL  INR     Status: Abnormal   Collection Time: 02/23/19 11:42 AM  Result Value Ref Range   Prothrombin Time 20.2 (H) 11.4 - 15.2 seconds   INR 1.8 (H) 0.8 - 1.2    Comment: (NOTE) INR goal varies based on device and disease states. Performed at [facility], [address], [place], [place] [address]   Glucose, capillary     Status: Abnormal   Collection Time: 02/23/19  4:48 PM  Result Value Ref Range   Glucose-Capillary 222 (H) 70 - 99 mg/dL    Studies/Results:     MRI MRA BRAIN FINDINGS: MRI HEAD FINDINGS  Motion artifact is present.  Brain: There is no acute infarction  or intracranial hemorrhage. Chronic right parietal infarct is present. Prominence of ventricles and sulci reflects generalized parenchymal volume loss. Additional patchy T2 hyperintensity in the supratentorial white matter is nonspecific but may reflect mild chronic microvascular ischemic changes. There is no intracranial mass, mass effect, or extra-axial fluid collection.  Vascular: Major vessel flow voids at the skull base are preserved.  Skull and upper cervical spine: Marrow signal is within normal limits.  Sinuses/Orbits: Minor mucosal thickening.  Orbits are unremarkable.  Other: None.  MRA HEAD FINDINGS  Motion artifact is present. Intracranial internal carotid arteries, middle cerebral arteries, and anterior cerebral arteries are patent. Mild multifocal atherosclerotic irregularity is present. Intracranial vertebral, basilar, and posterior cerebral arteries are patent. There is moderate to severe stenosis of the proximal right P2 PCA.  Diffuse atherosclerotic irregularity of the left P2 PCA with suspected high-grade stenosis in the P2/P3 junction.  MRA NECK FINDINGS  Included common carotid arteries are patent. Bilateral internal and external carotid arteries are patent. There is mild atherosclerotic irregularity at the ICA origins without measurable stenosis. Extracranial vertebral arteries are patent.  IMPRESSION: Suboptimal evaluation due to motion artifact.  No acute infarction, hemorrhage, or mass. Chronic right parietal infarct. Mild chronic microvascular ischemic changes.  No large vessel occlusion or hemodynamically significant stenosis in the neck. Intracranial atherosclerotic changes.     The brain MRI is reviewed in person. There is right parietal occipital watershed type encephalomalacia associated with increased signal on FLAIR imaging. There is moderate deep white matter periventricular leukoencephalopathy. There is a small area of increased signal seen on DWI involving the inferior cerebellum on the right side consistent with a small ischemic stroke.   TTE  1. Left ventricular ejection fraction, by visual estimation, is 60 to 65%. The left ventricle has normal function. There is severely increased left ventricular hypertrophy.  2. Left ventricular diastolic parameters are indeterminate.  3. Global right ventricle has normal systolic function.The right ventricular size is moderately enlarged. No increase in right ventricular wall thickness.  4. Left atrial size was severely dilated.  5. Right atrial size was severely dilated.  6. Mild mitral annular calcification.  7. The mitral valve is normal in structure. No evidence of mitral valve regurgitation. No evidence of mitral stenosis.  8. The tricuspid valve is abnormal. Tricuspid valve regurgitation is mild.  9. There is a large partially mobile vegetation attachted to the anterior leaflet of the tricuspid valve. Measures 1.8 x 2.4 cm. Somewhat  limited color Doppler, TR appears mild but may be underestimated. 10. Aortic valve regurgitation is mild to moderate. 11. The aortic valve is tricuspid. Aortic valve regurgitation is mild to moderate. No evidence of aortic valve sclerosis or stenosis. 12. The pulmonic valve was not well visualized. Pulmonic valve regurgitation is mild. 13. Aortic dilatation noted. 14. There is mild dilatation of the aortic root measuring 40 mm. 15. The proximal ascending aorta is mildly dilated at 3.9 cm. 16. Normal pulmonary artery systolic pressure. 17. The inferior vena cava is dilated in size with >50% respiratory variability, suggesting right atrial pressure of 8 mmHg. 18. The interatrial septum was not well visualized. 19. 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 Somewhat limited evaluation of TR, limited ability to determine if any interatrial shunting. Can consider TEE if would affect clinical management for either his endocarditis or stroke, however it  would appear the likely management regardless would be antibiotics and antiocoagulation.       Debbora Ang A. Gerilyn Pilgrim, M.D.  Diplomate, Biomedical engineer  of Psychiatry and Neurology ( Neurology). 02/23/2019, 6:05 PM

## 2019-02-23 NOTE — Plan of Care (Signed)
  Problem: Acute Rehab PT Goals(only PT should resolve) Goal: Pt Will Go Supine/Side To Sit Outcome: Progressing Flowsheets (Taken 02/23/2019 1452) Pt will go Supine/Side to Sit: with moderate assist Goal: Patient Will Transfer Sit To/From Stand Outcome: Progressing Flowsheets (Taken 02/23/2019 1452) Patient will transfer sit to/from stand: with moderate assist Goal: Pt Will Transfer Bed To Chair/Chair To Bed Outcome: Progressing Flowsheets (Taken 02/23/2019 1452) Pt will Transfer Bed to Chair/Chair to Bed: with mod assist Goal: Pt Will Ambulate Outcome: Progressing Flowsheets (Taken 02/23/2019 1452) Pt will Ambulate:  15 feet  with moderate assist  with rolling walker  with cane   2:53 PM, 02/23/19 Lonell Grandchild, MPT Physical Therapist with [facility] [phone] office [phone] mobile phone

## 2019-02-23 NOTE — Evaluation (Signed)
Physical Therapy Evaluation Patient Details Name: Luke Garcia MRN: [id number] DOB: May 05, 1950 Today's Date: 02/23/2019   History of Present Illness  Luke Garcia  is a 68 y.o. male with past medical history relevant for prior history of alcohol abuse, patient denies current use, hypertension and DM who  lives in camper behind friends house-presents to the ED with shortness of breath and noted to have multiple bruises on body.    Clinical Impression  Patient presents with slurred speech, weak and painful LUE possibly due to recent dislocation when attempting to get out of bed at home per patient.  Patient able to follow directions and answer questions appropriately, but has limited use of LUE, able to maintain sitting balance at bedside, very weak BLE and unable to attempt standing.  Patient also c/o dizziness while seated at bedside and required Max/total assist to reposition when put back to bed.  Patient will benefit from continued physical therapy in hospital and recommended venue below to increase strength, balance, endurance for safe ADLs and gait.    Follow Up Recommendations SNF    Equipment Recommendations  None recommended by PT    Recommendations for Other Services       Precautions / Restrictions Precautions Precautions: Fall Restrictions Weight Bearing Restrictions: No      Mobility  Bed Mobility Overal bed mobility: Needs Assistance Bed Mobility: Supine to Sit;Sit to Supine     Supine to sit: Max assist Sit to supine: Max assist   General bed mobility comments: slow labored movement, increased time  Transfers                    Ambulation/Gait                Stairs            Wheelchair Mobility    Modified Rankin (Stroke Patients Only)       Balance Overall balance assessment: Needs assistance Sitting-balance support: Feet supported;Bilateral upper extremity supported Sitting balance-Leahy Scale: Poor Sitting balance -  Comments: fair/poor seated at bedside                                     Pertinent Vitals/Pain Pain Assessment: Faces Faces Pain Scale: Hurts whole lot Pain Location: with movement of LUE secondary to dislocated left shoulder per patient Pain Descriptors / Indicators: Grimacing;Guarding;Sharp;Sore    Home Living Family/patient expects to be discharged to:: Private residence Living Arrangements: Alone Available Help at Discharge: Friend(s);Available PRN/intermittently Type of Home: Mobile home(Lives in a Camper) Home Access: Stairs to enter Entrance Stairs-Rails: Left Entrance Stairs-Number of Steps: 4 Home Layout: One level Home Equipment: Cane - single point;Shower seat;Bedside commode      Prior Function Level of Independence: Independent with assistive device(s)         Comments: household and short distanced community ambulator with [facility], uses electric cart when grocery shopping     Hand Dominance   Dominant Hand: Right    Extremity/Trunk Assessment   Upper Extremity Assessment Upper Extremity Assessment: Generalized weakness;RUE deficits/detail;LUE deficits/detail RUE Deficits / Details: grossly 4/5 RUE Sensation: WNL RUE Coordination: WNL LUE Deficits / Details: grossly 2/5, poor hand grip, unable to lift LUE against gravity more likely due to left dislocated shoulder with c/o severe pain LUE: Unable to fully assess due to pain    Lower Extremity Assessment Lower Extremity Assessment: Generalized weakness  Cervical / Trunk Assessment Cervical / Trunk Assessment: Kyphotic  Communication   Communication: Expressive difficulties  Cognition Arousal/Alertness: Awake/alert Behavior During Therapy: WFL for tasks assessed/performed Overall Cognitive Status: Within Functional Limits for tasks assessed                                        General Comments      Exercises     Assessment/Plan    PT Assessment Patient  needs continued PT services  PT Problem List Decreased strength;Decreased activity tolerance;Decreased balance;Decreased mobility       PT Treatment Interventions Gait training;Stair training;Functional mobility training;Therapeutic activities;Therapeutic exercise;Patient/family education    PT Goals (Current goals can be found in the Care Plan section)  Acute Rehab PT Goals Patient Stated Goal: return home after rehab PT Goal Formulation: With patient Time For Goal Achievement: 03/09/19 Potential to Achieve Goals: Fair    Frequency Min 3X/week   Barriers to discharge        Co-evaluation               AM-PAC PT "6 Clicks" Mobility  Outcome Measure Help needed turning from your back to your side while in a flat bed without using bedrails?: A Lot Help needed moving from lying on your back to sitting on the side of a flat bed without using bedrails?: A Lot Help needed moving to and from a bed to a chair (including a wheelchair)?: Total Help needed standing up from a chair using your arms (e.g., wheelchair or bedside chair)?: Total Help needed to walk in hospital room?: Total Help needed climbing 3-5 steps with a railing? : Total 6 Click Score: 8    End of Session   Activity Tolerance: Patient limited by fatigue Patient left: in bed;with call bell/phone within reach Nurse Communication: Mobility status PT Visit Diagnosis: Unsteadiness on feet (R26.81);Other abnormalities of gait and mobility (R26.89);Muscle weakness (generalized) (M62.81)    Time: 8299-3716 PT Time Calculation (min) (ACUTE ONLY): 25 min   Charges:   PT Evaluation $PT Eval Moderate Complexity: 1 Mod PT Treatments $Therapeutic Activity: 23-37 mins        2:48 PM, 02/23/19 Ocie Bob, MPT Physical Therapist with [facility] [phone] office [phone] mobile phone

## 2019-02-23 NOTE — TOC Initial Note (Signed)
Transition of Care [facility]) - Initial/Assessment Note    Patient Details  Name: ELVER STADLER MRN: [id number] Date of Birth: 03-05-1951  [facility] CM/SW Contact:    Boneta Lucks, RN Phone Number: 02/23/2019, 4:28 PM  Clinical Narrative:       Patient admitted for AFIB, after further testing, pt diagnosised with endocarditis. Patient will need a PICC line and weeks for antibiotics.  Patient lives in a camper in a friends back yard.  Patient is 21, and never applied for insurance. Patient has no PCP.  CM emailed Tito Dine, Development worker, community, she will get a him an application for age for medicaid.   TOC to follow.         Expected Discharge Plan: Skilled Nursing Facility Barriers to Discharge: Continued Medical Work up, Inadequate or no insurance   Patient Goals and CMS Choice   Expected Discharge Plan and Services Expected Discharge Plan: [place] arrangements for the past 2 months: No permanent address(camper/ in back yard of a friends house)                   Prior Living Arrangements/Services Living arrangements for the past 2 months: No permanent address(camper/ in back yard of a friends house) Lives with:: Self       Activities of Daily Living Home Assistive Devices/Equipment: Eyeglasses ADL Screening (condition at time of admission) Patient's cognitive ability adequate to safely complete daily activities?: Yes Is the patient deaf or have difficulty hearing?: No Does the patient have difficulty seeing, even when wearing glasses/contacts?: No Does the patient have difficulty concentrating, remembering, or making decisions?: No Patient able to express need for assistance with ADLs?: Yes Does the patient have difficulty dressing or bathing?: No Independently performs ADLs?: Yes (appropriate for developmental age) Does the patient have difficulty walking or climbing stairs?: Yes Weakness of Legs: Both Weakness of Arms/Hands:  None   Admission diagnosis:  Hyperglycemia [R73.9] Patient Active Problem List   Diagnosis Date Noted  . 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 Acute lower UTI 02/23/2019  . CVA (cerebral vascular accident)/Acute Cerebella Stroke 02/23/2019  . New onset a-fib ([place]) 02/28/2019  . HTN (hypertension) 02/24/2019  . Weakness generalized 03/02/2019  . Leukocytosis 02/24/2019  . Dyslipidemia 03/12/2012  . Chest pain, Troponin negative X 3 03/11/2012  . DM (diabetes mellitus), type 2, uncontrolled with complications ([place]) 56/81/2751  . Uncontrolled hypertension 03/11/2012  . A-fib, unknown duration 03/11/2012  . Alcohol abuse, daily use 03/11/2012   PCP:  Patient, No Pcp Per Pharmacy:   [facility], [place] [address] [place] [address] Phone: [phone] Fax: [phone]   Readmission Risk Interventions No flowsheet data found.

## 2019-02-23 NOTE — Progress Notes (Signed)
Patient's speech is slurred. He is extremely weak. Cannot turn head to the left side. Paged Dr. Joesph Fillers. Orders for STAT MRI of the head.

## 2019-02-23 NOTE — Progress Notes (Signed)
MD aware of blood culture results.

## 2019-02-23 NOTE — Consult Note (Signed)
Cardiology Consultation:   Patient ID: Luke Garcia MRN: [id number]; DOB: Apr 30, 1950  Admit date: 02/24/2019 Date of Consult: 02/23/2019  Primary Care Provider: Patient, No Pcp Per Primary Cardiologist: New Primary Electrophysiologist:  None    Patient Profile:   Luke Garcia is a 68 y.o. male with a hx of afib and EtoH abuse who is being seen today for the evaluation of afib with RVR  at the request of Dr Mariea Clonts.  History of Present Illness:   [name] 68 yo male history of afib not on anticoag due to a history of noncompliance and EtOH abuse in the past (see 2013 cardiology notes), DM2, HTN, EtOH abuse admitted with SOB.   In ER found to be in afib with RVR rates to 150s. Multiple lab abnormalities with hyperglyemia, leukocytosis, thrombocytopenia, anemia, AKI, hyponateremia. 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 No pertinent surgical history.   Inpatient Medications: Scheduled Meds:  atorvastatin  80 mg Oral q1800   carvedilol  3.125 mg Oral BID WC   Chlorhexidine Gluconate Cloth  6 each Topical Daily   folic acid  1 mg Oral Daily   furosemide  20 mg Intravenous Q12H   glipiZIDE  2.5 mg Oral BID AC   insulin aspart  0-5 Units Subcutaneous QHS   insulin aspart  0-9 Units Subcutaneous TID WC   multivitamin with minerals  1 tablet Oral  Daily   sodium chloride flush  3 mL Intravenous Q12H   thiamine  100 mg Oral Daily   Continuous Infusions:  sodium chloride     cefTRIAXone (ROCEPHIN)  IV 1 g (02/23/19 1429)   [START ON 02/24/2019] cefTRIAXone (ROCEPHIN)  IV     diltiazem (CARDIZEM) infusion 2.5 mg/hr (02/23/19 0900)   vancomycin     PRN Meds: sodium chloride, acetaminophen **OR** acetaminophen, albuterol, LORazepam, ondansetron **OR** ondansetron (ZOFRAN) IV, polyethylene glycol, sodium chloride flush, traZODone  Allergies:    Allergies  Allergen Reactions   Aspirin Anaphylaxis   Nsaids Anaphylaxis   Rhubarb    Strawberry Extract    Tylenol [Acetaminophen]     Social History:   Social History   Socioeconomic History   Marital status: Single    Spouse name: Not on file   Number of children: Not on file   Years of education: Not on file   Highest education level: Not on file  Occupational History   Not on file  Social Needs   Financial resource strain: Not on file   Food insecurity    Worry: Not on file    Inability: Not on file   Transportation needs    Medical: Not on file    Non-medical: Not on file  Tobacco Use   Smoking status: Never Smoker   Smokeless tobacco: Never Used  Substance and Sexual Activity  Alcohol use: Yes    Comment: Beer and vodka   Drug use: No   Sexual activity: Never  Lifestyle   Physical activity    Days per week: Not on file    Minutes per session: Not on file   Stress: Not on file  Relationships   Social connections    Talks on phone: Not on file    Gets together: Not on file    Attends religious service: Not on file    Active member of club or organization: Not on file    Attends meetings of clubs or organizations: Not on file    Relationship status: Not on file   Intimate partner violence    Fear of current or ex partner: Not on file    Emotionally abused: Not on file    Physically abused: Not on file    Forced sexual  activity: Not on file  Other Topics Concern   Not on file  Social History Narrative   Not on file    Family History:    Family History  Problem Relation Age of Onset   Cancer - Other Mother    Pancreatic cancer Father    Congestive Heart Failure Father      ROS:  Please see the history of present illness.  All other ROS reviewed and negative.     Physical Exam/Data:   Vitals:   02/23/19 1120 02/23/19 1130 02/23/19 1200 02/23/19 1300  BP: (!) 110/57 99/64 (!) 99/59 (!) 114/59  Pulse: 75 (!) 101 (!) 111 72  Resp: (!) 26 (!) 23 (!) 22 (!) 24  Temp:  97.9 F (36.6 C)    TempSrc:  Oral    SpO2: 100% 100% 99% 99%  Weight:      Height:        Intake/Output Summary (Last 24 hours) at 02/23/2019 1432 Last data filed at 02/23/2019 1000 Gross per 24 hour  Intake 589.52 ml  Output 425 ml  Net 164.52 ml   Last 3 Weights 02/23/2019 03/03/2019 03/12/2012  Weight (lbs) 206 lb 5.6 oz 203 lb 0.7 oz 241 lb 2.9 oz  Weight (kg) 93.6 kg 92.1 kg 109.4 kg     Body mass index is 27.22 kg/m.  General:  Well nourished, well developed, in no acute distress HEENT: normal Lymph: no adenopathy Neck: +JVD Endocrine:  No thryomegaly Vascular: No carotid bruits; FA pulses 2+ bilaterally without bruits  Cardiac:  irreg Lungs:  clear to auscultation bilaterally, no wheezing, rhonchi or rales  Abd: soft, nontender, no hepatomegaly  Ext: 1+ bilateral LE edema Musculoskeletal:  No deformities, BUE and BLE strength normal and equal Skin: warm and dry  Psych:  Normal affect    Laboratory Data:  High Sensitivity Troponin:   Recent Labs  Lab 2018-08-13 1135 2018-08-13 1324  TROPONINIHS 11 12     Chemistry Recent Labs  Lab 2018-08-13 1135 02/23/19 0425  NA 129* 130*  K 4.1 3.7  CL 96* 100  CO2 20* 18*  GLUCOSE 421* 231*  BUN 49* 52*  CREATININE 2.13* 2.19*  CALCIUM 7.5* 7.3*  GFRNONAA 31* 30*  GFRAA 36* 35*  ANIONGAP 13 12    Recent Labs  Lab 2018-08-13 1148  PROT 7.3    ALBUMIN 1.9*  AST 35  ALT 19  ALKPHOS 137*  BILITOT 4.6*   Hematology Recent Labs  Lab 2018-08-13 1135 02/23/19 0425  WBC 26.6* 28.4*  RBC 2.34* 2.08*  HGB 8.6* 7.6*  HCT 26.6* 23.6*  MCV 113.7* 113.5*  MCH 36.8* 36.5*  MCHC 32.3 32.2  RDW 14.9 14.9  PLT 37* 34*   BNP Recent Labs  Lab 02/21/2019 1135  BNP 544.0*    DDimer No results for input(s): DDIMER in the last 168 hours.   Radiology/Studies:  Ct Head Wo Contrast  Result Date: 02/28/2019 CLINICAL DATA:  Weakness and shortness of breath EXAM: CT HEAD WITHOUT CONTRAST TECHNIQUE: Contiguous axial images were obtained from the base of the skull through the vertex without intravenous contrast. COMPARISON:  None. FINDINGS: Brain: There is no mass, hemorrhage or extra-axial collection. There is generalized atrophy without lobar predilection. Hypodensity of the white matter is most commonly associated with chronic microvascular disease. There is an old right parieto-occipital infarct. Vascular: Atherosclerotic calcification of the vertebral and internal carotid arteries at the skull base. No abnormal hyperdensity of the major intracranial arteries or dural venous sinuses. Skull: The visualized skull base, calvarium and extracranial soft tissues are normal. Sinuses/Orbits: No fluid levels or advanced mucosal thickening of the visualized paranasal sinuses. No mastoid or middle ear effusion. The orbits are normal. IMPRESSION: 1. No acute intracranial abnormality. 2. 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 Electronically Signed   By: Ulyses Jarred M.D.   On: 02/28/2019 22:20   Mr Angio Head Wo Contrast  Addendum Date: 02/23/2019   ADDENDUM REPORT: 02/23/2019 14:05 ADDENDUM: There is a punctate focus of restricted diffusion in the right cerebellar hemisphere reflecting acute infarct. Electronically Signed   By: Macy Mis M.D.   On: 02/23/2019 14:05   Result Date: 02/23/2019 CLINICAL DATA:   Speech difficulty EXAM: MRI HEAD WITHOUT CONTRAST MRA HEAD WITHOUT CONTRAST MRA NECK WITHOUT CONTRAST TECHNIQUE: Multiplanar, multiecho pulse sequences of the brain and surrounding structures were obtained without intravenous contrast. Angiographic images of the Circle of Willis were obtained using MRA technique without intravenous contrast. Angiographic images of the neck were obtained using MRA technique without intravenous contrast. Carotid stenosis measurements (when applicable) are obtained utilizing NASCET criteria, using the distal internal carotid diameter as the denominator. COMPARISON:  None. FINDINGS: MRI HEAD FINDINGS Motion artifact is present. Brain: There is no acute infarction or intracranial hemorrhage. Chronic right parietal infarct is present. Prominence of ventricles and sulci reflects generalized parenchymal volume loss. Additional patchy T2 hyperintensity in the supratentorial white matter is nonspecific but may reflect mild chronic microvascular ischemic changes. There is no intracranial mass, mass effect, or extra-axial fluid collection. Vascular: Major vessel flow voids at the skull base are preserved. Skull and upper cervical spine: Marrow signal is within normal limits. Sinuses/Orbits: Minor mucosal thickening.  Orbits are unremarkable. Other: None. MRA HEAD FINDINGS Motion artifact is present. Intracranial internal carotid arteries, middle cerebral arteries, and anterior cerebral arteries are patent. Mild multifocal atherosclerotic irregularity is present. Intracranial vertebral, basilar, and posterior cerebral arteries are patent. There is moderate to severe stenosis of the proximal right P2 PCA. Diffuse atherosclerotic irregularity of the left P2 PCA with suspected high-grade stenosis in the P2/P3 junction. MRA NECK FINDINGS Included common carotid arteries are patent. Bilateral internal and external carotid arteries are patent. There is mild atherosclerotic irregularity at the ICA  origins without measurable stenosis. Extracranial vertebral arteries are patent. IMPRESSION: Suboptimal evaluation due to motion artifact. No acute infarction, hemorrhage, or mass. Chronic right parietal infarct. Mild chronic microvascular ischemic changes. No large vessel occlusion or hemodynamically significant stenosis in the neck. Intracranial atherosclerotic changes. Electronically Signed: By: Macy Mis M.D. On: 02/23/2019 12:18   Mr  Angio Neck Wo Contrast  Addendum Date: 02/23/2019   ADDENDUM REPORT: 02/23/2019 14:05 ADDENDUM: There is a punctate focus of restricted diffusion in the right cerebellar hemisphere reflecting acute infarct. Electronically Signed   By: Guadlupe Spanish M.D.   On: 02/23/2019 14:05   Result Date: 02/23/2019 CLINICAL DATA:  Speech difficulty EXAM: MRI HEAD WITHOUT CONTRAST MRA HEAD WITHOUT CONTRAST MRA NECK WITHOUT CONTRAST TECHNIQUE: Multiplanar, multiecho pulse sequences of the brain and surrounding structures were obtained without intravenous contrast. Angiographic images of the Circle of Willis were obtained using MRA technique without intravenous contrast. Angiographic images of the neck were obtained using MRA technique without intravenous contrast. Carotid stenosis measurements (when applicable) are obtained utilizing NASCET criteria, using the distal internal carotid diameter as the denominator. COMPARISON:  None. FINDINGS: MRI HEAD FINDINGS Motion artifact is present. Brain: There is no acute infarction or intracranial hemorrhage. Chronic right parietal infarct is present. Prominence of ventricles and sulci reflects generalized parenchymal volume loss. Additional patchy T2 hyperintensity in the supratentorial white matter is nonspecific but may reflect mild chronic microvascular ischemic changes. There is no intracranial mass, mass effect, or extra-axial fluid collection. Vascular: Major vessel flow voids at the skull base are preserved. Skull and upper cervical  spine: Marrow signal is within normal limits. Sinuses/Orbits: Minor mucosal thickening.  Orbits are unremarkable. Other: None. MRA HEAD FINDINGS Motion artifact is present. Intracranial internal carotid arteries, middle cerebral arteries, and anterior cerebral arteries are patent. Mild multifocal atherosclerotic irregularity is present. Intracranial vertebral, basilar, and posterior cerebral arteries are patent. There is moderate to severe stenosis of the proximal right P2 PCA. Diffuse atherosclerotic irregularity of the left P2 PCA with suspected high-grade stenosis in the P2/P3 junction. MRA NECK FINDINGS Included common carotid arteries are patent. Bilateral internal and external carotid arteries are patent. There is mild atherosclerotic irregularity at the ICA origins without measurable stenosis. Extracranial vertebral arteries are patent. IMPRESSION: Suboptimal evaluation due to motion artifact. No acute infarction, hemorrhage, or mass. Chronic right parietal infarct. Mild chronic microvascular ischemic changes. No large vessel occlusion or hemodynamically significant stenosis in the neck. Intracranial atherosclerotic changes. Electronically Signed: By: Guadlupe Spanish M.D. On: 02/23/2019 12:18   Mr Brain Wo Contrast  Addendum Date: 02/23/2019   ADDENDUM REPORT: 02/23/2019 14:05 ADDENDUM: There is a punctate focus of restricted diffusion in the right cerebellar hemisphere reflecting acute infarct. Electronically Signed   By: Guadlupe Spanish M.D.   On: 02/23/2019 14:05   Result Date: 02/23/2019 CLINICAL DATA:  Speech difficulty EXAM: MRI HEAD WITHOUT CONTRAST MRA HEAD WITHOUT CONTRAST MRA NECK WITHOUT CONTRAST TECHNIQUE: Multiplanar, multiecho pulse sequences of the brain and surrounding structures were obtained without intravenous contrast. Angiographic images of the Circle of Willis were obtained using MRA technique without intravenous contrast. Angiographic images of the neck were obtained using MRA  technique without intravenous contrast. Carotid stenosis measurements (when applicable) are obtained utilizing NASCET criteria, using the distal internal carotid diameter as the denominator. COMPARISON:  None. FINDINGS: MRI HEAD FINDINGS Motion artifact is present. Brain: There is no acute infarction or intracranial hemorrhage. Chronic right parietal infarct is present. Prominence of ventricles and sulci reflects generalized parenchymal volume loss. Additional patchy T2 hyperintensity in the supratentorial white matter is nonspecific but may reflect mild chronic microvascular ischemic changes. There is no intracranial mass, mass effect, or extra-axial fluid collection. Vascular: Major vessel flow voids at the skull base are preserved. Skull and upper cervical spine: Marrow signal is within normal limits. Sinuses/Orbits: Minor mucosal thickening.  Orbits are unremarkable. Other: None. MRA HEAD FINDINGS Motion artifact is present. Intracranial internal carotid arteries, middle cerebral arteries, and anterior cerebral arteries are patent. Mild multifocal atherosclerotic irregularity is present. Intracranial vertebral, basilar, and posterior cerebral arteries are patent. There is moderate to severe stenosis of the proximal right P2 PCA. Diffuse atherosclerotic irregularity of the left P2 PCA with suspected high-grade stenosis in the P2/P3 junction. MRA NECK FINDINGS Included common carotid arteries are patent. Bilateral internal and external carotid arteries are patent. There is mild atherosclerotic irregularity at the ICA origins without measurable stenosis. Extracranial vertebral arteries are patent. IMPRESSION: Suboptimal evaluation due to motion artifact. No acute infarction, hemorrhage, or mass. Chronic right parietal infarct. Mild chronic microvascular ischemic changes. No large vessel occlusion or hemodynamically significant stenosis in the neck. Intracranial atherosclerotic changes. Electronically Signed: By:  Guadlupe Spanish M.D. On: 02/23/2019 12:18   Dg Chest Portable 1 View  Result Date: March 15, 2019 CLINICAL DATA:  Shortness of breath and hyperglycemia for several days. EXAM: PORTABLE CHEST 1 VIEW COMPARISON:  PA and lateral chest 03/11/2012. FINDINGS: The lungs are clear. Heart size is normal. Aortic atherosclerosis noted. No pneumothorax or pleural fluid. No acute or focal bony abnormality. IMPRESSION: No acute disease. Atherosclerosis. Electronically Signed   By: Drusilla Kanner M.D.   On: 03-15-19 11:56    Assessment and Plan:   1. 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  - I don't see where a TEE would change management, if needed let us know.    2. Acute CVA -  Has TV vegetation, no clear signs of right to left shunting but somewhat limited evaluation of interatrial septum - long history of afib, patient was not compliant with anticoag at home. I would think likely the cause of his CVA - I don't see where a TEE would change management, if needed let us know.  - with anemia and thrombocytopenia not a candidate for anticoag at this time  3. Afib - long history dating back several years - medication noncompliance from patient, including with anticoag - during this admit afib with RVR, was started on dilt gtt  - not an anticoag candidate at this time due to severe thrombocyopenia - soft bp's, would change coreg to lopressor 25mg  po tid with hold parameters. D/c dilt gtt.     4. Thrombocytopenia - unclear if related to liver disease and his EtOH use, or more acute such as DIC related to sepsis - avoid anticoag  5. Anemia - per primary team  6. AKI - uknown baseline - avoid nephrotoxic meds  7. Hyperbilirubinemia/Elevated INR - abd pending, long EtOH history.   8. Acute on chronic diastolic HF - gentle diuresis given soft bp's, renal dysfunction. Continue  IV lasix 20mg  bid    For questions or updates, please contact [facility] Please consult [email or web address] for contact info under     Signed, [place], MD  02/23/2019 2:32 PM

## 2019-02-23 NOTE — Evaluation (Signed)
Clinical/Bedside Swallow Evaluation Patient Details  Name: Luke Garcia MRN: [id number] Date of Birth: Aug 02, 1950  Today's Date: 02/23/2019 Time: SLP Start Time (ACUTE ONLY): 1535 SLP Stop Time (ACUTE ONLY): 1612 SLP Time Calculation (min) (ACUTE ONLY): 37 min  Past Medical History:  Past Medical History:  Diagnosis Date  . Atrial fibrillation (HCC)   . Diabetes mellitus without complication (HCC)   . Hypertension    Past Surgical History: History reviewed. No pertinent surgical history. HPI:  Luke Garcia  is a 68 y.o. male with past medical history relevant for prior history of alcohol abuse, patient denies current use, hypertension and DM who  lives in camper behind friends house-presents to the ED with shortness of breath and noted to have multiple bruises on body. MRI reveals "There is a punctate focus of restricted diffusion in the right cerebellar hemisphere reflecting acute infarct." BSE ordered to evaluate swallowing function.   Assessment / Plan / Recommendation Clinical Impression  Pt was seated upright in bed for clinical swallowing evaluation (CSE); Note decreased vocal intensity with significant delay before answering most questions verbally. Pt consumed thin liquids initially with no coughing or response, however note delayed wet vocal quality and delayed coughing; Pt also demonstrated wet vocal quality and delayed coughing response with NTL trials. Pt consumed regular textures with decreased oral coordination, prolonged mastication and mild oral residue after the swallow; seemingly improved bolus cohesion with puree textures however, still prolonged oral prep stage with puree. Recommend initiate conservative diet of HONEY thick liquids and D2/fine chop diet; Meds to be crushed in puree. It is impossible to rule out aspiration and/or silent aspiration at bedside; Pt presents with the risk factors of decreased vocal intensity and decreased oral coordination in conjunction with s/sx  of oropharyngeal dysphagia presented during clinical swallowing evaluation therefore recommend proceed with objective swallowing evaluation tomorrow as schedule permits.    Furthermore, note Pt presents with a stutter which Pt reports to be baseline prior to this hospitalization; consider this when noting what appears to be slow and labored processing of language. Pt will also benefit from Speech Language Evaluation (SLE) to further evaluate what appears to be dysarthria and aphasia (briefly screened during CSE). SLP Visit Diagnosis: Dysphagia, unspecified (R13.10)    Aspiration Risk  Moderate aspiration risk    Diet Recommendation Dysphagia 2 (Fine chop);Honey-thick liquid   Liquid Administration via: Cup Medication Administration: Crushed with puree Supervision: Full supervision/cueing for compensatory strategies;Staff to assist with self feeding Compensations: Minimize environmental distractions;Slow rate;Small sips/bites;Follow solids with liquid;Multiple dry swallows after each bite/sip Postural Changes: Seated upright at 90 degrees;Remain upright for at least 30 minutes after po intake    Other  Recommendations Oral Care Recommendations: Oral care BID Other Recommendations: Order thickener from pharmacy   Follow up Recommendations        Frequency and Duration min 1 x/week  1 week       Prognosis Prognosis for Safe Diet Advancement: Good Barriers to Reach Goals: Severity of deficits      Swallow Study   General Date of Onset: 02/23/2019 HPI: Luke Garcia  is a 68 y.o. male with past medical history relevant for prior history of alcohol abuse, patient denies current use, hypertension and DM who  lives in camper behind friends house-presents to the ED with shortness of breath and noted to have multiple bruises on body. MRI reveals "There is a punctate focus of restricted diffusion in the right cerebellar hemisphere reflecting acute infarct." BSE ordered to evaluate  swallowing  function. Type of Study: Bedside Swallow Evaluation Previous Swallow Assessment: none Diet Prior to this Study: NPO Temperature Spikes Noted: No Respiratory Status: Room air History of Recent Intubation: No Behavior/Cognition: Alert;Cooperative;Pleasant mood Oral Cavity Assessment: Within Functional Limits Oral Care Completed by SLP: Recent completion by staff Oral Cavity - Dentition: Adequate natural dentition Vision: Functional for self-feeding Self-Feeding Abilities: Able to feed self;Needs assist Patient Positioning: Upright in bed Baseline Vocal Quality: Low vocal intensity Volitional Cough: Weak Volitional Swallow: Unable to elicit    Oral/Motor/Sensory Function Overall Oral Motor/Sensory Function: Within functional limits   Ice Chips Ice chips: Within functional limits   Thin Liquid Thin Liquid: Impaired Presentation: Spoon;Cup Oral Phase Impairments: Poor awareness of bolus Oral Phase Functional Implications: Prolonged oral transit Pharyngeal  Phase Impairments: Cough - Delayed;Wet Vocal Quality    Nectar Thick Nectar Thick Liquid: Impaired Presentation: Cup Oral Phase Impairments: Poor awareness of bolus Pharyngeal Phase Impairments: Multiple swallows;Cough - Delayed   Honey Thick Honey Thick Liquid: Not tested   Puree Puree: Impaired Presentation: Spoon Oral Phase Impairments: Poor awareness of bolus;Reduced lingual movement/coordination Oral Phase Functional Implications: Prolonged oral transit;Oral residue Pharyngeal Phase Impairments: Wet Vocal Quality   Solid     Solid: Impaired Presentation: Self Fed Oral Phase Impairments: Impaired mastication;Reduced lingual movement/coordination Oral Phase Functional Implications: Prolonged oral transit;Impaired mastication;Oral residue Pharyngeal Phase Impairments: Multiple swallows;Wet Vocal Quality;Cough - Delayed     Berea Majkowski H. Roddie Mc, [place] Speech Language Pathologist  Wende Bushy 02/23/2019,4:12  PM

## 2019-02-23 NOTE — Progress Notes (Signed)
Inpatient Diabetes Program Recommendations  AACE/ADA: New Consensus Statement on Inpatient Glycemic Control (2015)  Target Ranges:  Prepandial:   less than 140 mg/dL      Peak postprandial:   less than 180 mg/dL (1-2 hours)      Critically ill patients:  140 - 180 mg/dL   Lab Results  Component Value Date   GLUCAP 232 (H) 02/23/2019   HGBA1C 6.8 (H) 2019/02/27    Review of Glycemic Control Results for Luke Garcia, Luke Garcia (MRN [id number]) as of 02/23/2019 11:15  Ref. Range 2019-02-27 17:36 02-27-2019 21:02 02/23/2019 07:41  Glucose-Capillary Latest Ref Range: 70 - 99 mg/dL 377 (H) 362 (H) 232 (H)   Diabetes history: Type 2 DM Outpatient Diabetes medications: Metformin 500 mg BID, Glipizide 2.5 mg BID (Not taking) Current orders for Inpatient glycemic control: Novolog 0-9 units TID, Novolog 0-5 units QHS, Glipizide 2.5 mg BID  Inpatient Diabetes Program Recommendations:    Question accuracy of A1C in the setting of Hemoglobin <10.   Noted glucose trends exceeding 200's mg/dL, may want to consider adding Levemir 8 units QD.   Thanks, Bronson Curb, MSN, RNC-OB Diabetes Coordinator [phone] (8a-5p)

## 2019-02-24 ENCOUNTER — Inpatient Hospital Stay (HOSPITAL_COMMUNITY): Payer: Medicaid Other

## 2019-02-24 ENCOUNTER — Other Ambulatory Visit (HOSPITAL_COMMUNITY): Payer: Self-pay

## 2019-02-24 DIAGNOSIS — R531 Weakness: Secondary | ICD-10-CM

## 2019-02-24 DIAGNOSIS — R739 Hyperglycemia, unspecified: Secondary | ICD-10-CM

## 2019-02-24 DIAGNOSIS — Z21 Asymptomatic human immunodeficiency virus [HIV] infection status: Secondary | ICD-10-CM

## 2019-02-24 DIAGNOSIS — N390 Urinary tract infection, site not specified: Secondary | ICD-10-CM

## 2019-02-24 DIAGNOSIS — I639 Cerebral infarction, unspecified: Secondary | ICD-10-CM

## 2019-02-24 DIAGNOSIS — I079 Rheumatic tricuspid valve disease, unspecified: Secondary | ICD-10-CM

## 2019-02-24 LAB — PANEL 083904
HIV 1 AB: NEGATIVE
HIV 2 AB: NEGATIVE
Note: NEGATIVE

## 2019-02-24 LAB — COMPREHENSIVE METABOLIC PANEL
ALT: 17 U/L (ref 0–44)
AST: 31 U/L (ref 15–41)
Albumin: 1.8 g/dL — ABNORMAL LOW (ref 3.5–5.0)
Alkaline Phosphatase: 123 U/L (ref 38–126)
Anion gap: 12 (ref 5–15)
BUN: 59 mg/dL — ABNORMAL HIGH (ref 8–23)
CO2: 18 mmol/L — ABNORMAL LOW (ref 22–32)
Calcium: 7.4 mg/dL — ABNORMAL LOW (ref 8.9–10.3)
Chloride: 99 mmol/L (ref 98–111)
Creatinine, Ser: 2.39 mg/dL — ABNORMAL HIGH (ref 0.61–1.24)
GFR calc Af Amer: 31 mL/min — ABNORMAL LOW (ref >60)
GFR calc non Af Amer: 27 mL/min — ABNORMAL LOW (ref >60)
Glucose, Bld: 181 mg/dL — ABNORMAL HIGH (ref 70–99)
Potassium: 3.8 mmol/L (ref 3.5–5.1)
Sodium: 129 mmol/L — ABNORMAL LOW (ref 135–145)
Total Bilirubin: 4 mg/dL — ABNORMAL HIGH (ref 0.3–1.2)
Total Protein: 6.8 g/dL (ref 6.5–8.1)

## 2019-02-24 LAB — AMMONIA: Ammonia: 25 umol/L (ref 9–35)

## 2019-02-24 LAB — CBC
HCT: 28.6 % — ABNORMAL LOW (ref 39.0–52.0)
Hemoglobin: 8.9 g/dL — ABNORMAL LOW (ref 13.0–17.0)
MCH: 36.5 pg — ABNORMAL HIGH (ref 26.0–34.0)
MCHC: 31.1 g/dL (ref 30.0–36.0)
MCV: 117.2 fL — ABNORMAL HIGH (ref 80.0–100.0)
Platelets: 21 10*3/uL — CL (ref 150–400)
RBC: 2.44 MIL/uL — ABNORMAL LOW (ref 4.22–5.81)
RDW: 15 % (ref 11.5–15.5)
WBC: 23.8 10*3/uL — ABNORMAL HIGH (ref 4.0–10.5)
nRBC: 0 % (ref 0.0–0.2)

## 2019-02-24 LAB — GLUCOSE, CAPILLARY
Glucose-Capillary: 172 mg/dL — ABNORMAL HIGH (ref 70–99)
Glucose-Capillary: 206 mg/dL — ABNORMAL HIGH (ref 70–99)
Glucose-Capillary: 232 mg/dL — ABNORMAL HIGH (ref 70–99)

## 2019-02-24 MED ORDER — SODIUM CHLORIDE 0.9 % IV SOLN
INTRAVENOUS | Status: DC
  Administered 2019-02-24: 10:00:00 via INTRAVENOUS

## 2019-02-24 NOTE — Progress Notes (Signed)
[facility] Jawann Urbani A. Gerilyn Pilgrimoonquah, MD     [email or web address]          Luke Garcia is an 68 y.o. male.   Assessment/Plan: 1. Acute encephalopathy with unresponsiveness and dysarthria. The etiology is most likely multifactorial. 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  Follow-up EEG. 2.  New onset Atrial fibrillation: Long-term the patient should be anticoagulated but given the thrombocytopenia, this should be held for now. Consider anticoagulation once platelets are above 100,000. 3.  Remote large vessel right parietal occipital infarct 4. Thrombocytopenia 5. 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   The patient seems more responsive but still quite confused.    GENERAL:  The patient is somewhat groggy. He does participate in the evaluation however.  HEENT:   Neck is supple no trauma appreciated.  EXTREMITIES: No edema   BACK: Normal alignment.  SKIN: Normal by inspection.    MENTAL STATUS:   He is more responsive today.  He still is confused.  He is only oriented to himself.  He thinks is at home.  He does follow midline commands but not appendicular commands.  CRANIAL NERVES: Pupils are equal, round and reactive to light; extraocular movements are full, there is no significant nystagmus; upper and lower facial muscles are normal in strength and symmetric, there is no flattening of the nasolabial folds; tongue is midline  MOTOR:  He has antigravity strength in all 4 extremities. Bulk and tone are normal. Exact strength is not known given the lack of cooperation however.  COORDINATION:  No evidence of dysmetria, myoclonus or tremors. No parkinsonism.  REFLEXES: Deep tendon reflexes are symmetrical and normal.    SENSATION:  He responds normally to painful stimuli.      Objective: Vital signs in last 24 hours: Temp:  [97.6 F (36.4 C)-99.9 F (37.7 C)] 99.9 F (37.7 C) (11/11 1145) Pulse Rate:  [66-100] 96 (11/11 1145) Resp:  [16-27] 16 (11/11 1145) BP: (86-104)/(56-72) 98/64 (11/11 1145) SpO2:  [100 %] 100 % (11/11 1145) Weight:  [91.9 kg] 91.9 kg (11/11 0500)  Intake/Output from previous day: 11/10 0701 - 11/11 0700 In: 694.2 [I.V.:66.5; IV Piggyback:627.7] Out: 275 [Urine:275] Intake/Output this shift: Total I/O In: 131.7 [I.V.:31.7; IV Piggyback:100] Out: -  Nutritional status:  Diet Order            DIET DYS 2 Room service appropriate? Yes; Fluid consistency: Honey Thick  Diet effective now               Lab Results: Results for orders placed or performed during the hospital encounter of 03/02/2019 (from the past 48 hour(s))  Glucose, capillary     Status: Abnormal   Collection Time: 03/10/2019  9:02 PM  Result Value Ref Range   Glucose-Capillary 362 (H) 70 - 99 mg/dL   Comment 1 Notify RN    Comment 2 Document in Chart   Culture, blood (Routine X 2) w Reflex to ID Panel     Status: Abnormal (Preliminary result)   Collection Time: 03/05/2019  9:03 PM   Specimen: BLOOD  Result Value Ref Range   Specimen Description      BLOOD RIGHT ANTECUBITAL Performed at [facility], [address] [place], [place] [id number]    Special Requests  BOTTLES DRAWN AEROBIC AND ANAEROBIC Blood Culture adequate volume Performed at [facility], [address], [place], [place] [address]    Culture  Setup Time      GRAM POSITIVE COCCI IN BOTH AEROBIC AND ANAEROBIC BOTTLES PREVIOUSLY CALLED OTHER SET AT 1108A ON 111020 TO HYLTON L. BY THOMPSON S. Performed at [facility], [address], [place], [place] [address]    Culture (A)     STAPHYLOCOCCUS EPIDERMIDIS SUSCEPTIBILITIES TO FOLLOW Performed at [facility], [address], [place], [place] [address]    Report Status  PENDING   Culture, blood (Routine X 2) w Reflex to ID Panel     Status: Abnormal (Preliminary result)   Collection Time: 02/23/2019  9:08 PM   Specimen: BLOOD RIGHT HAND  Result Value Ref Range   Specimen Description      BLOOD RIGHT HAND Performed at [facility], [address] [place], [place] [address]    Special Requests      BOTTLES DRAWN AEROBIC AND ANAEROBIC Blood Culture adequate volume Performed at [facility], [address], [place], [place] [address]    Culture  Setup Time      GRAM POSITIVE COCCI IN BOTH AEROBIC AND ANAEROBIC BOTTLES Gram Stain Report Called to,Read Back By and Verified With: HYLTON L. AT 1108A ON 111020 BY THOMPSON S. Organism ID to follow CRITICAL RESULT CALLED TO, READ BACK BY AND VERIFIED WITH: Alda Ponder RN 02/23/19 1936 JDW Performed at [facility], [address], [place], [place] [address]    Culture (A)     STAPHYLOCOCCUS EPIDERMIDIS SUSCEPTIBILITIES TO FOLLOW Performed at [facility], [address], [place], [place] [address]    Report Status PENDING   Blood Culture ID Panel (Reflexed)     Status: Abnormal   Collection Time: 03/07/2019  9:08 PM  Result Value Ref Range   Enterococcus species NOT DETECTED NOT DETECTED   Listeria monocytogenes NOT DETECTED NOT DETECTED   Staphylococcus species DETECTED (A) NOT DETECTED    Comment: Methicillin (oxacillin) susceptible coagulase negative staphylococcus. Possible blood culture contaminant (unless isolated from more than one blood culture draw or clinical case suggests pathogenicity). No antibiotic treatment is indicated for blood  culture contaminants. CRITICAL RESULT CALLED TO, READ BACK BY AND VERIFIED WITH: H TETREAULT RN 02/23/19 1936 JDW    Staphylococcus aureus (BCID) NOT DETECTED NOT DETECTED   Methicillin resistance NOT DETECTED NOT DETECTED   Streptococcus species NOT DETECTED NOT DETECTED   Streptococcus agalactiae NOT DETECTED NOT DETECTED   Streptococcus pneumoniae NOT  DETECTED NOT DETECTED   Streptococcus pyogenes NOT DETECTED NOT DETECTED   Acinetobacter baumannii NOT DETECTED NOT DETECTED   Enterobacteriaceae species NOT DETECTED NOT DETECTED   Enterobacter cloacae complex NOT DETECTED NOT DETECTED   Escherichia coli NOT DETECTED NOT DETECTED   Klebsiella oxytoca NOT DETECTED NOT DETECTED   Klebsiella pneumoniae NOT DETECTED NOT DETECTED   Proteus species NOT DETECTED NOT DETECTED   Serratia marcescens NOT DETECTED NOT DETECTED   Haemophilus influenzae NOT DETECTED NOT DETECTED   Neisseria meningitidis NOT DETECTED NOT DETECTED   Pseudomonas aeruginosa NOT DETECTED NOT DETECTED   Candida albicans NOT DETECTED NOT DETECTED   Candida glabrata NOT DETECTED NOT DETECTED   Candida krusei NOT DETECTED NOT DETECTED   Candida parapsilosis NOT DETECTED NOT DETECTED   Candida tropicalis NOT DETECTED NOT DETECTED    Comment: Performed at [facility], [address], [place], [place] [address]  Basic metabolic  panel     Status: Abnormal   Collection Time: 02/23/19  4:25 AM  Result Value Ref Range   Sodium 130 (L) 135 - 145 mmol/L   Potassium 3.7 3.5 - 5.1 mmol/L   Chloride 100 98 - 111 mmol/L   CO2 18 (L) 22 - 32 mmol/L   Glucose, Bld 231 (H) 70 - 99 mg/dL   BUN 52 (H) 8 - 23 mg/dL   Creatinine, Ser 1.19 (H) 0.61 - 1.24 mg/dL   Calcium 7.3 (L) 8.9 - 10.3 mg/dL   GFR calc non Af Amer 30 (L) >60 mL/min   GFR calc Af Amer 35 (L) >60 mL/min   Anion gap 12 5 - 15    Comment: Performed at [facility], [address] [place], [place] [address]  CBC     Status: Abnormal   Collection Time: 02/23/19  4:25 AM  Result Value Ref Range   WBC 28.4 (H) 4.0 - 10.5 K/uL   RBC 2.08 (L) 4.22 - 5.81 MIL/uL   Hemoglobin 7.6 (L) 13.0 - 17.0 g/dL   HCT 95.6 (L) 21.3 - 08.6 %   MCV 113.5 (H) 80.0 - 100.0 fL   MCH 36.5 (H) 26.0 - 34.0 pg   MCHC 32.2 30.0 - 36.0 g/dL   RDW 57.8 46.9 - 62.9 %   Platelets 34 (L) 150 - 400 K/uL    Comment: SPECIMEN CHECKED FOR CLOTS  Immature Platelet Fraction may be clinically indicated, consider ordering this additional test [id number] CONSISTENT WITH PREVIOUS RESULT    nRBC 0.0 0.0 - 0.2 %    Comment: Performed at [facility], [address], [place], [place] [address]  HIV Antibody (routine testing w rflx)     Status: Abnormal   Collection Time: 02/23/19  4:25 AM  Result Value Ref Range   HIV Screen 4th Generation wRfx Reactive (A) NON REACTIVE    Comment: (NOTE) Reactive result does not distinguish HIV-1 p24 antigen, HIV-1  antibody, HIV-2 antibody, and HIV-1 group O antibody. Results  reactive by HIV Antigen/Antibody EIA must be confirmed. Sent for  confirmation. Performed at [facility], [address], [place], [place] [address]   Panel [id number]     Status: None   Collection Time: 02/23/19  4:25 AM  Result Value Ref Range   HIV 1 AB Negative Negative   HIV 2 AB Negative Negative   Note Negative     Comment: (NOTE) Recommend follow-up testing for HIV-1 RNA. Performed At: [facility] [address] [place], [place] [id number] Jolene Schimke MD VF:[id number]   Glucose, capillary     Status: Abnormal   Collection Time: 02/23/19  7:41 AM  Result Value Ref Range   Glucose-Capillary 232 (H) 70 - 99 mg/dL  Glucose, capillary     Status: Abnormal   Collection Time: 02/23/19 11:25 AM  Result Value Ref Range   Glucose-Capillary 206 (H) 70 - 99 mg/dL  INR     Status: Abnormal   Collection Time: 02/23/19 11:42 AM  Result Value Ref Range   Prothrombin Time 20.2 (H) 11.4 - 15.2 seconds   INR 1.8 (H) 0.8 - 1.2    Comment: (NOTE) INR goal varies based on device and disease states. Performed at [facility], [address], [place], [place] [address]   Glucose, capillary     Status: Abnormal   Collection Time: 02/23/19  4:48 PM  Result Value Ref Range   Glucose-Capillary 222 (H) 70 - 99 mg/dL  Glucose,  capillary     Status: Abnormal   Collection Time: 02/23/19  9:17 PM  Result Value Ref  Range   Glucose-Capillary 185 (H) 70 - 99 mg/dL  Ammonia     Status: None   Collection Time: 02/24/19  4:07 AM  Result Value Ref Range   Ammonia 25 9 - 35 umol/L    Comment: Performed at [facility], [address] [place], [place] [address]  Comprehensive metabolic panel/CMP     Status: Abnormal   Collection Time: 02/24/19  4:07 AM  Result Value Ref Range   Sodium 129 (L) 135 - 145 mmol/L   Potassium 3.8 3.5 - 5.1 mmol/L   Chloride 99 98 - 111 mmol/L   CO2 18 (L) 22 - 32 mmol/L   Glucose, Bld 181 (H) 70 - 99 mg/dL   BUN 59 (H) 8 - 23 mg/dL   Creatinine, Ser 6.04 (H) 0.61 - 1.24 mg/dL   Calcium 7.4 (L) 8.9 - 10.3 mg/dL   Total Protein 6.8 6.5 - 8.1 g/dL   Albumin 1.8 (L) 3.5 - 5.0 g/dL   AST 31 15 - 41 U/L   ALT 17 0 - 44 U/L   Alkaline Phosphatase 123 38 - 126 U/L   Total Bilirubin 4.0 (H) 0.3 - 1.2 mg/dL   GFR calc non Af Amer 27 (L) >60 mL/min   GFR calc Af Amer 31 (L) >60 mL/min   Anion gap 12 5 - 15    Comment: Performed at [facility], [address] [place], [place] [address]  CBC     Status: Abnormal   Collection Time: 02/24/19  4:07 AM  Result Value Ref Range   WBC 23.8 (H) 4.0 - 10.5 K/uL   RBC 2.44 (L) 4.22 - 5.81 MIL/uL   Hemoglobin 8.9 (L) 13.0 - 17.0 g/dL   HCT 11.9 (L) 14.7 - 82.9 %   MCV 117.2 (H) 80.0 - 100.0 fL   MCH 36.5 (H) 26.0 - 34.0 pg   MCHC 31.1 30.0 - 36.0 g/dL   RDW 56.2 13.0 - 86.5 %   Platelets 21 (LL) 150 - 400 K/uL    Comment: Immature Platelet Fraction may be clinically indicated, consider ordering this additional test [id number] THIS CRITICAL RESULT HAS VERIFIED AND BEEN CALLED TO A AMBURN,RN BY MARIE KELLY ON 11 11 2020 AT 0511, AND HAS BEEN READ BACK.     nRBC 0.0 0.0 - 0.2 %    Comment: Performed at [facility], [address], [place], [place] [address]  Glucose, capillary     Status: Abnormal   Collection Time: 02/24/19  7:58 AM  Result Value Ref Range   Glucose-Capillary 172 (H) 70 - 99 mg/dL  Culture, blood (Routine X 2) w  Reflex to ID Panel     Status: None (Preliminary result)   Collection Time: 02/24/19  1:17 PM   Specimen: BLOOD RIGHT ARM  Result Value Ref Range   Specimen Description BLOOD RIGHT ARM    Special Requests      BOTTLES DRAWN AEROBIC AND ANAEROBIC Blood Culture adequate volume Performed at [facility], [address] [place], [place] [address]    Culture PENDING    Report Status PENDING   Culture, blood (Routine X 2) w Reflex to ID Panel     Status: None (Preliminary result)   Collection Time: 02/24/19  1:25 PM   Specimen: BLOOD RIGHT HAND  Result Value Ref Range   Specimen Description BLOOD RIGHT HAND  Special Requests      BOTTLES DRAWN AEROBIC ONLY Blood Culture results may not be optimal due to an inadequate volume of blood received in culture bottles Performed at [facility], [address], [place], [place] [address]    Culture PENDING    Report Status PENDING   Glucose, capillary     Status: Abnormal   Collection Time: 02/24/19  4:02 PM  Result Value Ref Range   Glucose-Capillary 206 (H) 70 - 99 mg/dL    Lipid Panel No results for input(s): CHOL, TRIG, HDL, CHOLHDL, VLDL, LDLCALC in the last 72 hours.  Studies/Results:   Medications:  Scheduled Meds: . atorvastatin  80 mg Oral q1800  . Chlorhexidine Gluconate Cloth  6 each Topical Daily  . folic acid  1 mg Oral Daily  . insulin aspart  0-5 Units Subcutaneous QHS  . insulin aspart  0-9 Units Subcutaneous TID WC  . metoprolol tartrate  25 mg Oral BID  . multivitamin with minerals  1 tablet Oral Daily  . thiamine  100 mg Oral Daily   Continuous Infusions: . sodium chloride 50 mL/hr at 02/24/19 1013  . cefTRIAXone (ROCEPHIN)  IV 2 g (02/24/19 1015)  . levETIRAcetam Stopped (02/24/19 0859)  . vancomycin     PRN Meds:.acetaminophen **OR** acetaminophen, albuterol, LORazepam, ondansetron **OR** ondansetron (ZOFRAN) IV, polyethylene glycol, traZODone     LOS: 1 day   Emberley Kral A. Merlene Laughter, M.D.  Diplomate, [employer] of Psychiatry and Neurology ( Neurology).

## 2019-02-24 NOTE — Evaluation (Signed)
Speech Language Pathology Evaluation Patient Details Name: Luke Garcia MRN: [id number] DOB: 07-Sep-1950 Today's Date: 02/24/2019 Time: 7616-0737 SLP Time Calculation (min) (ACUTE ONLY): 23 min  Problem List:  Patient Active Problem List   Diagnosis Date Noted  . 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 Acute lower UTI 02/23/2019  . CVA (cerebral vascular accident)/Acute Cerebella Stroke 02/23/2019  . HIV (human immunodeficiency virus infection) -- Needs Further confirmation and w/u by ID  02/23/2019  . Endocarditis 02/23/2019  . AKI (acute kidney injury) (HCC) 02/23/2019  . New onset a-fib (HCC) 03/09/2019  . HTN (hypertension) 02/14/2019  . Weakness generalized 02/21/2019  . Leukocytosis 02/28/2019  . Dyslipidemia 03/12/2012  . Chest pain, Troponin negative X 3 03/11/2012  . DM (diabetes mellitus), type 2, uncontrolled with complications (HCC) 03/11/2012  . Uncontrolled hypertension 03/11/2012  . A-fib, unknown duration 03/11/2012  . Alcohol abuse, daily use 03/11/2012   Past Medical History:  Past Medical History:  Diagnosis Date  . Atrial fibrillation (HCC)   . Diabetes mellitus without complication (HCC)   . Hypertension    Past Surgical History: History reviewed. No pertinent surgical history. HPI:  Luke Garcia  is a 68 y.o. male with past medical history relevant for prior history of alcohol abuse, patient denies current use, hypertension and DM who  lives in camper behind friends house-presents to the ED with shortness of breath and noted to have multiple bruises on body. MRI reveals "There is a punctate focus of restricted diffusion in the right cerebellar hemisphere reflecting acute infarct." BSE ordered to evaluate swallowing function.   Assessment / Plan / Recommendation Clinical Impression  Note mild/moderate dysarthria compounded by decreased vocal intensity and baseline stutter; Pt's intelligibility is noted to be ~50-75% intelligible.  However note decreased intelligibility today vs. yesterday. Also note considerable decrease in alertness today from yesterday with Pt requiring moderate to maximal verbal and tactile cues to attend to questions.  Pt did accurately answer basic biographical y/n questions with 75% accuracy. Naming objects in immediate environment with 80% accuracy. Also note considerable decrease in alertness today from yesterday with Pt requiring moderate to maximal verbal and tactile cues to attend to questions. He was unable to follow any one step commands despite verbal cues and models provided. Pt also questionably presents with mild/moderate aphasia however Pt's alertness today decreased ability to assess aphasia. Pt does present with slow processing time and seemingly increased delay and initiation however baseline speech/language is unknown. Pt will benefit from continued ST for speech and language and more in depth SLE in secondary venue.    SLP Assessment  SLP Visit Diagnosis: Dysphagia, unspecified (R13.10)    Follow Up Recommendations       Frequency and Duration min 1 x/week  1 week      SLP Evaluation Cognition  Overall Cognitive Status: Impaired/Different from baseline Arousal/Alertness: Lethargic Orientation Level: Oriented to person;Disoriented to place;Disoriented to situation Attention: Focused Focused Attention: Impaired Focused Attention Impairment: Verbal basic Memory: Impaired Memory Impairment: Decreased short term memory Decreased Short Term Memory: Verbal basic Awareness: Impaired Awareness Impairment: Intellectual impairment Problem Solving: Impaired Problem Solving Impairment: Verbal basic Executive Function: Reasoning;Decision Making;Initiating Reasoning: Impaired Reasoning Impairment: Verbal basic Decision Making: Impaired Decision Making Impairment: Verbal basic Initiating: Impaired Initiating Impairment: Verbal basic       Comprehension  Auditory  Comprehension Overall Auditory Comprehension: Impaired Yes/No Questions: Within Functional Limits Commands: Impaired One Step Basic Commands: 0-24% accurate Conversation: Simple Other Conversation  Comments: Pt with significant delay when responding to questions  Interfering Components: (Baseline stutter)    Expression Verbal Expression Overall Verbal Expression: Impaired at baseline Automatic Speech: Name;Counting Level of Generative/Spontaneous Verbalization: Word Repetition: No impairment Naming: Impairment Responsive: 76-100% accurate Written Expression Written Expression: Not tested   Oral / Motor  Oral Motor/Sensory Function Overall Oral Motor/Sensory Function: (Unable to follow commands to complete ORal mech) Motor Speech Overall Motor Speech: Impaired Articulation: Impaired Level of Impairment: Word Intelligibility: Intelligibility reduced Word: 50-74% accurate Phrase: 50-74% accurate Sentence: 50-74% accurate Conversation: 25-49% accurate     Bryler Dibble H. Roddie Mc, CCC-SLP Speech Language Pathologist         Wende Bushy 02/24/2019, 2:35 PM

## 2019-02-24 NOTE — Evaluation (Signed)
Modified Barium Swallow Progress Note  Patient Details  Name: Luke Garcia MRN: [id number] Date of Birth: February 28, 1951  Today's Date: 02/24/2019  Modified Barium Swallow completed.  Full report located under Chart Review in the Imaging Section.  Brief recommendations include the following:  Clinical Impression  Pt presents with moderately severe oropharyngeal dysphagia; Pt presents with significant lethargy today which is a change from presentation during bedside evaluation yesterday and despite mod/max verbal and tactile cues Pt had difficulty holding his head erect for test. Oral Dysphagia is characterized by decreased awareness of bolus, decreased bolus cohesion and decreased bolus propulsion resulting in anterior spillage of which patient is unaware and premature spillage. Pharyngeal stage is characterized by decreased laryngeal excursion, no epiglottic deflection, decreased pharyngeal sqeeze, and decreased laryngeal vestibule closure all resulting in multiple episodes of SILENT aspiration occuring before, during and after the swallow. Pt was unable to provide cough on command and aspirates were not visualized being cleared from airway. Note moderate/severe residue of all textures in valleculae and mild residue in pyriforms and lateral channels, head turn attempted and was ineffective for decreasing residue. Pt with prolonged oral prep stage with puree with delayed swallow trigger and significant residue noted in valleculae. MBS was terminated without attempting further textures (Regular, HTL, & barium tablet not attempted) secondary to Pts declining alertness as test progressed. Pt is noted to be at [place] for aspiration and recommend NPO pending Pt's alertness/mentation improves also recommend consider repeat MBSS as/ if becomes clinically appropriate. ST will continue to follow while in acute setting.   Swallow Evaluation Recommendations    Compensations: Minimize environmental  distractions;Slow rate;Small sips/bites;Follow solids with liquid;Clear throat intermittently;Hard cough after swallow   Postural Changes: Seated upright at 90 degrees;Remain semi-upright after after feeds/meals (Comment)    DIET: NPO pending improved alertness/mentation; consider repeat MBSS as clinically appropriate     Luke Garcia H. Roddie Mc, CCC-SLP Speech Language Pathologist    Wende Bushy 02/24/2019,2:29 PM

## 2019-02-24 NOTE — Progress Notes (Signed)
Progress Note  Patient Name: Luke SandiferJames E Scheer Date of Encounter: 02/24/2019  Primary Cardiologist: New  Subjective   Fatigue this morning  Inpatient Medications    Scheduled Meds:  atorvastatin  80 mg Oral q1800   Chlorhexidine Gluconate Cloth  6 each Topical Daily   folic acid  1 mg Oral Daily   furosemide  20 mg Intravenous Q12H   glipiZIDE  2.5 mg Oral BID AC   insulin aspart  0-5 Units Subcutaneous QHS   insulin aspart  0-9 Units Subcutaneous TID WC   metoprolol tartrate  25 mg Oral BID   multivitamin with minerals  1 tablet Oral Daily   sodium chloride flush  3 mL Intravenous Q12H   thiamine  100 mg Oral Daily   Continuous Infusions:  sodium chloride 5 mL/hr at 02/24/19 0300   cefTRIAXone (ROCEPHIN)  IV     levETIRAcetam     vancomycin     PRN Meds: sodium chloride, acetaminophen **OR** acetaminophen, albuterol, LORazepam, ondansetron **OR** ondansetron (ZOFRAN) IV, polyethylene glycol, sodium chloride flush, traZODone   Vital Signs    Vitals:   02/24/19 0400 02/24/19 0500 02/24/19 0600 02/24/19 0700  BP: 97/65 103/62 95/72   Pulse:  77 74   Resp: 19 (!) 23 (!) 21   Temp: 97.6 F (36.4 C)   97.9 F (36.6 C)  TempSrc: Axillary   Oral  SpO2:  100% 100%   Weight:  91.9 kg    Height:        Intake/Output Summary (Last 24 hours) at 02/24/2019 0808 Last data filed at 02/24/2019 0300 Gross per 24 hour  Intake 684.54 ml  Output 275 ml  Net 409.54 ml   Last 3 Weights 02/24/2019 02/23/2019 03/04/2019  Weight (lbs) 202 lb 9.6 oz 206 lb 5.6 oz 203 lb 0.7 oz  Weight (kg) 91.9 kg 93.6 kg 92.1 kg      Telemetry    Rate controlled aifb - Personally Reviewed  ECG    n/a - Personally Reviewed  Physical Exam   GEN: No acute distress.   Neck: No JVD Cardiac: irreg, no murmurs, rubs, or gallops.  Respiratory: Clear to auscultation bilaterally. GI: Soft, nontender, non-distended  MS: 1-2+ bilateral LE edema; No deformity. Neuro:  Nonfocal    Psych: Normal affect   Labs    High Sensitivity Troponin:   Recent Labs  Lab 02/21/2019 1135 03/07/2019 1324  TROPONINIHS 11 12      Chemistry Recent Labs  Lab 02/14/2019 1135 02/17/2019 1148 02/23/19 0425 02/24/19 0407  NA 129*  --  130* 129*  K 4.1  --  3.7 3.8  CL 96*  --  100 99  CO2 20*  --  18* 18*  GLUCOSE 421*  --  231* 181*  BUN 49*  --  52* 59*  CREATININE 2.13*  --  2.19* 2.39*  CALCIUM 7.5*  --  7.3* 7.4*  PROT  --  7.3  --  6.8  ALBUMIN  --  1.9*  --  1.8*  AST  --  35  --  31  ALT  --  19  --  17  ALKPHOS  --  137*  --  123  BILITOT  --  4.6*  --  4.0*  GFRNONAA 31*  --  30* 27*  GFRAA 36*  --  35* 31*  ANIONGAP 13  --  12 12     Hematology Recent Labs  Lab 03/07/2019 1135 02/23/19 0425 02/24/19 0407  WBC 26.6* 28.4* 23.8*  RBC 2.34* 2.08* 2.44*  HGB 8.6* 7.6* 8.9*  HCT 26.6* 23.6* 28.6*  MCV 113.7* 113.5* 117.2*  MCH 36.8* 36.5* 36.5*  MCHC 32.3 32.2 31.1  RDW 14.9 14.9 15.0  PLT 37* 34* 21*    BNP Recent Labs  Lab 02/17/2019 1135  BNP 544.0*     DDimer No results for input(s): DDIMER in the last 168 hours.   Radiology    Ct Head Wo Contrast  Result Date: 03/12/2019 CLINICAL DATA:  Weakness and shortness of breath EXAM: CT HEAD WITHOUT CONTRAST TECHNIQUE: Contiguous axial images were obtained from the base of the skull through the vertex without intravenous contrast. COMPARISON:  None. FINDINGS: Brain: There is no mass, hemorrhage or extra-axial collection. There is generalized atrophy without lobar predilection. Hypodensity of the white matter is most commonly associated with chronic microvascular disease. There is an old right parieto-occipital infarct. Vascular: Atherosclerotic calcification of the vertebral and internal carotid arteries at the skull base. No abnormal hyperdensity of the major intracranial arteries or dural venous sinuses. Skull: The visualized skull base, calvarium and extracranial soft tissues are normal. Sinuses/Orbits:  No fluid levels or advanced mucosal thickening of the visualized paranasal sinuses. No mastoid or middle ear effusion. The orbits are normal. IMPRESSION: 1. No acute intracranial abnormality. 2. 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 Electronically Signed   By: Ulyses Jarred M.D.   On: 02/21/2019 22:20   Mr Angio Head Wo Contrast  Addendum Date: 02/23/2019   ADDENDUM REPORT: 02/23/2019 14:05 ADDENDUM: There is a punctate focus of restricted diffusion in the right cerebellar hemisphere reflecting acute infarct. Electronically Signed   By: Macy Mis M.D.   On: 02/23/2019 14:05   Result Date: 02/23/2019 CLINICAL DATA:  Speech difficulty EXAM: MRI HEAD WITHOUT CONTRAST MRA HEAD WITHOUT CONTRAST MRA NECK WITHOUT CONTRAST TECHNIQUE: Multiplanar, multiecho pulse sequences of the brain and surrounding structures were obtained without intravenous contrast. Angiographic images of the Circle of Willis were obtained using MRA technique without intravenous contrast. Angiographic images of the neck were obtained using MRA technique without intravenous contrast. Carotid stenosis measurements (when applicable) are obtained utilizing NASCET criteria, using the distal internal carotid diameter as the denominator. COMPARISON:  None. FINDINGS: MRI HEAD FINDINGS Motion artifact is present. Brain: There is no acute infarction or intracranial hemorrhage. Chronic right parietal infarct is present. Prominence of ventricles and sulci reflects generalized parenchymal volume loss. Additional patchy T2 hyperintensity in the supratentorial white matter is nonspecific but may reflect mild chronic microvascular ischemic changes. There is no intracranial mass, mass effect, or extra-axial fluid collection. Vascular: Major vessel flow voids at the skull base are preserved. Skull and upper cervical spine: Marrow signal is within normal limits. Sinuses/Orbits: Minor mucosal thickening.  Orbits  are unremarkable. Other: None. MRA HEAD FINDINGS Motion artifact is present. Intracranial internal carotid arteries, middle cerebral arteries, and anterior cerebral arteries are patent. Mild multifocal atherosclerotic irregularity is present. Intracranial vertebral, basilar, and posterior cerebral arteries are patent. There is moderate to severe stenosis of the proximal right P2 PCA. Diffuse atherosclerotic irregularity of the left P2 PCA with suspected high-grade stenosis in the P2/P3 junction. MRA NECK FINDINGS Included common carotid arteries are patent. Bilateral internal and external carotid arteries are patent. There is mild atherosclerotic irregularity at the ICA origins without measurable stenosis. Extracranial vertebral arteries are patent. IMPRESSION: Suboptimal evaluation due to motion artifact. No acute infarction, hemorrhage, or mass. Chronic right parietal infarct. Mild chronic microvascular  ischemic changes. No large vessel occlusion or hemodynamically significant stenosis in the neck. Intracranial atherosclerotic changes. Electronically Signed: By: Guadlupe Spanish M.D. On: 02/23/2019 12:18   Mr Angio Neck Wo Contrast  Addendum Date: 02/23/2019   ADDENDUM REPORT: 02/23/2019 14:05 ADDENDUM: There is a punctate focus of restricted diffusion in the right cerebellar hemisphere reflecting acute infarct. Electronically Signed   By: Guadlupe Spanish M.D.   On: 02/23/2019 14:05   Result Date: 02/23/2019 CLINICAL DATA:  Speech difficulty EXAM: MRI HEAD WITHOUT CONTRAST MRA HEAD WITHOUT CONTRAST MRA NECK WITHOUT CONTRAST TECHNIQUE: Multiplanar, multiecho pulse sequences of the brain and surrounding structures were obtained without intravenous contrast. Angiographic images of the Circle of Willis were obtained using MRA technique without intravenous contrast. Angiographic images of the neck were obtained using MRA technique without intravenous contrast. Carotid stenosis measurements (when applicable) are  obtained utilizing NASCET criteria, using the distal internal carotid diameter as the denominator. COMPARISON:  None. FINDINGS: MRI HEAD FINDINGS Motion artifact is present. Brain: There is no acute infarction or intracranial hemorrhage. Chronic right parietal infarct is present. Prominence of ventricles and sulci reflects generalized parenchymal volume loss. Additional patchy T2 hyperintensity in the supratentorial white matter is nonspecific but may reflect mild chronic microvascular ischemic changes. There is no intracranial mass, mass effect, or extra-axial fluid collection. Vascular: Major vessel flow voids at the skull base are preserved. Skull and upper cervical spine: Marrow signal is within normal limits. Sinuses/Orbits: Minor mucosal thickening.  Orbits are unremarkable. Other: None. MRA HEAD FINDINGS Motion artifact is present. Intracranial internal carotid arteries, middle cerebral arteries, and anterior cerebral arteries are patent. Mild multifocal atherosclerotic irregularity is present. Intracranial vertebral, basilar, and posterior cerebral arteries are patent. There is moderate to severe stenosis of the proximal right P2 PCA. Diffuse atherosclerotic irregularity of the left P2 PCA with suspected high-grade stenosis in the P2/P3 junction. MRA NECK FINDINGS Included common carotid arteries are patent. Bilateral internal and external carotid arteries are patent. There is mild atherosclerotic irregularity at the ICA origins without measurable stenosis. Extracranial vertebral arteries are patent. IMPRESSION: Suboptimal evaluation due to motion artifact. No acute infarction, hemorrhage, or mass. Chronic right parietal infarct. Mild chronic microvascular ischemic changes. No large vessel occlusion or hemodynamically significant stenosis in the neck. Intracranial atherosclerotic changes. Electronically Signed: By: Guadlupe Spanish M.D. On: 02/23/2019 12:18   Mr Brain Wo Contrast  Addendum Date: 02/23/2019    ADDENDUM REPORT: 02/23/2019 14:05 ADDENDUM: There is a punctate focus of restricted diffusion in the right cerebellar hemisphere reflecting acute infarct. Electronically Signed   By: Guadlupe Spanish M.D.   On: 02/23/2019 14:05   Result Date: 02/23/2019 CLINICAL DATA:  Speech difficulty EXAM: MRI HEAD WITHOUT CONTRAST MRA HEAD WITHOUT CONTRAST MRA NECK WITHOUT CONTRAST TECHNIQUE: Multiplanar, multiecho pulse sequences of the brain and surrounding structures were obtained without intravenous contrast. Angiographic images of the Circle of Willis were obtained using MRA technique without intravenous contrast. Angiographic images of the neck were obtained using MRA technique without intravenous contrast. Carotid stenosis measurements (when applicable) are obtained utilizing NASCET criteria, using the distal internal carotid diameter as the denominator. COMPARISON:  None. FINDINGS: MRI HEAD FINDINGS Motion artifact is present. Brain: There is no acute infarction or intracranial hemorrhage. Chronic right parietal infarct is present. Prominence of ventricles and sulci reflects generalized parenchymal volume loss. Additional patchy T2 hyperintensity in the supratentorial white matter is nonspecific but may reflect mild chronic microvascular ischemic changes. There is no intracranial mass, mass effect, or extra-axial  fluid collection. Vascular: Major vessel flow voids at the skull base are preserved. Skull and upper cervical spine: Marrow signal is within normal limits. Sinuses/Orbits: Minor mucosal thickening.  Orbits are unremarkable. Other: None. MRA HEAD FINDINGS Motion artifact is present. Intracranial internal carotid arteries, middle cerebral arteries, and anterior cerebral arteries are patent. Mild multifocal atherosclerotic irregularity is present. Intracranial vertebral, basilar, and posterior cerebral arteries are patent. There is moderate to severe stenosis of the proximal right P2 PCA. Diffuse  atherosclerotic irregularity of the left P2 PCA with suspected high-grade stenosis in the P2/P3 junction. MRA NECK FINDINGS Included common carotid arteries are patent. Bilateral internal and external carotid arteries are patent. There is mild atherosclerotic irregularity at the ICA origins without measurable stenosis. Extracranial vertebral arteries are patent. IMPRESSION: Suboptimal evaluation due to motion artifact. No acute infarction, hemorrhage, or mass. Chronic right parietal infarct. Mild chronic microvascular ischemic changes. No large vessel occlusion or hemodynamically significant stenosis in the neck. Intracranial atherosclerotic changes. Electronically Signed: By: Guadlupe Spanish M.D. On: 02/23/2019 12:18   US Abdomen Complete  Result Date: 02/23/2019 CLINICAL DATA:  Acute on chronic renal failure. EXAM: ABDOMEN ULTRASOUND COMPLETE COMPARISON:  None. FINDINGS: Gallbladder: Poorly visualized. Common bile duct: Diameter: Not identified. Liver: Very irregular nodular contour liver along with coarse heterogeneous echogenicity highly suspicious for cirrhosis. No obvious focal hepatic lesions. Difficult to evaluate due to patient motion. IVC: Normal caliber Pancreas: Visualized portion unremarkable. Spleen: Normal size.  No focal lesions. Right Kidney: Length: 12.9 cm. Normal renal cortical thickness and echogenicity without focal lesions or hydronephrosis. Left Kidney: Length: 12.9 cm. Normal renal cortical thickness and echogenicity without focal lesions or hydronephrosis. Abdominal aorta: Poorly visualized due to bowel gas and patient motion. Other findings: Small volume ascites. IMPRESSION: 1. Very limited examination.  Patient was very uncooperative. 2. Irregular nodular contour the liver with coarse heterogeneous echogenicity suggesting cirrhosis. 3. Small volume ascites. 4. Poor visualization of the gallbladder, common bile duct and aorta. 5. The pancreas, spleen and both kidneys are unremarkable.  Electronically Signed   By: Rudie Meyer M.D.   On: 02/23/2019 17:02   Dg Chest Portable 1 View  Result Date: 02/28/2019 CLINICAL DATA:  Shortness of breath and hyperglycemia for several days. EXAM: PORTABLE CHEST 1 VIEW COMPARISON:  PA and lateral chest 03/11/2012. FINDINGS: The lungs are clear. Heart size is normal. Aortic atherosclerosis noted. No pneumothorax or pleural fluid. No acute or focal bony abnormality. IMPRESSION: No acute disease. Atherosclerosis. Electronically Signed   By: Drusilla Kanner M.D.   On: 02/21/2019 11:56    Cardiac Studies     Patient Profile     Mackay Hanauer Rosser is a 68 y.o. male with a hx of afib and EtoH abuse who is being seen today for the evaluation of afib with RVR  at the request of Dr Mariea Clonts.  Assessment & Plan    1. 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  - I don't see where a TEE would change management, if needed let us know.    2. Acute CVA -  Has TV vegetation, no clear signs of right to left shunting but somewhat limited evaluation of interatrial septum - long history of afib, patient was not compliant with anticoag at home. I would think likely the cause of his CVA - I don't see where a TEE would change management, if needed let  us know.  - with anemia and thrombocytopenia not a candidate for anticoag at this time  - ongoing thrombocytopenia, continue to avoid anticoag  3. Afib - long history dating back several years - medication noncompliance from patient, including with anticoag - during this admit afib with RVR, was started on dilt gtt  - not an anticoag candidate at this time due to severe thrombocyopenia - rates controlled on oral lopressor alone, continue current dosing.     4. Thrombocytopenia - unclear if related to liver disease and his EtOH use, or more acute such as DIC related to sepsis - avoid  anticoag  5. Anemia - per primary team  6. AKI - uknown baseline - avoid nephrotoxic meds - uptrending Cr, we will d/c IV lasix.   7. Hyperbilirubinemia/Elevated INR -  long EtOH history, abd [place] suggests cirrhosis   8. Acute on chronic diastolic HF - worsening renal function/ d/c IV lasix. His residual LE edema may be more related to cirrhosis and hypoalbuminemia as opposed to ongoing HF. He is oxygentating well on room air alone, no further diuresis at this time.     Not much to add from cardiac standpoint. We will sign off inpatient care, please call with questions.    For questions or updates, please contact [facility] Please consult [email or web address] for contact info under        Signed, Dina Rich, MD  02/24/2019, 8:08 AM

## 2019-02-24 NOTE — Progress Notes (Addendum)
Inpatient Diabetes Program Recommendations  AACE/ADA: New Consensus Statement on Inpatient Glycemic Control (2015)  Target Ranges:  Prepandial:   less than 140 mg/dL      Peak postprandial:   less than 180 mg/dL (1-2 hours)      Critically ill patients:  140 - 180 mg/dL   Lab Results  Component Value Date   GLUCAP 206 (H) 02/24/2019   HGBA1C 6.8 (H) 03/02/2019    Review of Glycemic Control Results for KRISH, BAILLY (MRN [id number]) as of 02/24/2019 16:16  Ref. Range 02/23/2019 16:48 02/23/2019 21:17 02/24/2019 07:58 02/24/2019 16:02  Glucose-Capillary Latest Ref Range: 70 - 99 mg/dL 222 (H) 185 (H) 172 (H) 206 (H)  Diabetes history: Type 2 DM Outpatient Diabetes medications: Metformin 500 mg BID, Glipizide 2.5 mg BID (Not taking) Current orders for Inpatient glycemic control: Novolog 0-9 units TID, Novolog 0-5 units QHS, Glipizide 2.5 mg BID Inpatient Diabetes Program Recommendations:    Please consider adding Levemir 8 units daily if appropriate.    Thanks  Adah Perl, RN, BC-ADM Inpatient Diabetes Coordinator Pager [phone] (8a-5p)

## 2019-02-24 NOTE — TOC Progression Note (Signed)
Transition of Care [facility]) - Progression Note    Patient Details  Name: Luke Garcia MRN: [id number] Date of Birth: 1950-06-09  [facility]) CM/SW Contact  Shade Flood, LCSW Phone Number: 02/24/2019, 1:32 PM  Clinical Narrative:     TOC following. Pt status discussed with MD in Progression today. Pt will need IV anbx at dc. PT is also recommending SNF. TOC supervisor indicated that TOC can look for a SNF willing to take an LOG from [facility]. [place] financial counselor currently working on [employer] for pt.   TOC will work on SNF referrals as soon as appropriate.  Expected Discharge Plan: [place] Barriers to Discharge: Continued Medical Work up, Inadequate or no insurance  Expected Discharge Plan and Services Expected Discharge Plan: [place] arrangements for the past 2 months: No permanent address(camper/ in back yard of a friends house)                                       Social Determinants of Health (SDOH) Interventions    Readmission Risk Interventions No flowsheet data found.

## 2019-02-24 NOTE — Progress Notes (Signed)
Pt left for a procedure and is unavailable for EEG. Will attempt tomorrow when schedule permits

## 2019-02-24 NOTE — Progress Notes (Addendum)
PROGRESS NOTE    Luke Garcia  GYI:[id number] DOB: 1951/02/05 DOA: 03-24-2019 PCP: Patient, No Pcp Per    Brief Narrative:  68 year old male who presented with hyperglycemia.  He does have significant past medical history for alcohol abuse, hypertension and diabetes mellitus.  He reported dyspnea, and he was noted to have multiple bruises.  On his initial physical examination his heart rate was 150, atrial fibrillation with rapid ventricular response, his lungs were clear to auscultation bilaterally, his abdomen was soft, he had generalized weakness but more left than right, especially left upper extremity, multiple excoriated abrasions. Sodium 129, potassium 4.1, chloride 96, bicarb 20, glucose 421, BUN 49, creatinine 2.1, white count 26.6, hemoglobin 8.6, hematocrit 26.6, platelets 37.  SARS COVID-19 was negative.  Urine analysis negative for infection.  Drug screen negative.  His chest x-ray had no infiltrates, left lower lobe atelectasis.  EKG 139 bpm, normal axis, normal QRS and QTc, atrial fibrillation rhythm, poor R wave progression, no ST segment or T wave changes.  Patient was admitted to the hospital with a working diagnosis of new onset atrial fibrillation with rapid ventricular response, complicated by hyperglycemia and acute kidney injury.   Further work-up with transthoracic echocardiography showed a large partially mobile vegetation attached to the anterior leaflet of the tricuspid valve.  Brain MRI with acute cerebellar infarct.  He has been started on antibiotic therapy with intravenous vancomycin, per infectious disease recommendations.  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 Tricuspid valve endocarditis, gram positive cocci bacteremia. Patient continue to be hemodynamic stable with blood pressure 96 to 103 mmHg. WBC is down to 23 from 28. Will conitnue antibiotic therapy with Vancomycin and continue to follow up on cultures. Patient continue to be at high risk for clinical worsening.   2. Acute cerebellar infarct/ with possible seizures. Patient continue to have left upper extremity weakness, will continue supportive medical therapy with aspiration precautions and statin. Holding antiplatelet therapy due to thrombocytopenia. Will continue keppra per neurology recommendations.   3. Atrial fibrillation. Continue rate control with metoprolol, continue telemetry monitoring. ECHO with preserved LV systolic function. Not on anticoagulation due to thrombocytopenia,  4. T2DM. Continue glucose control with insulin sliding scale. Patient tolerating po with aspiration precautions.   5. Liver cirrhosis. No signs of decompensation, continue supportive  medical care. Hx of alcohol abuse with no signs of active withdrawal.   6. AKI with hyponatremia. Serum Na at 129, with cr at 2,39 with K at 3,8 and serum bicarbonate at 18. Continue to follow up on renal function and electrolytes. Will continue slow rate of saline at 50 ml per H.   7. HIV. Positive, will add HIV RNA and follow with ID as outpatient.   8. UTI. Continue antibiotic therapy with ceftriaxone. Wbc from 28 to 23, will continue to follow cell count.   DVT prophylaxis: enoxaparin   Code Status:  full Family Communication: no family at the bedside  Disposition Plan/ discharge barriers: pending clinical improvement.   Body mass  index is 26.73 kg/m. Malnutrition Type:      Malnutrition Characteristics:      Nutrition Interventions:     RN Pressure  Injury Documentation:     Consultants:   Cardiology   ID   Neurology   Procedures:     Antimicrobials:       Subjective: Patient is feeling better, no nausea or vomiting and has been tolerating po well. Continue to have slow response to questions and to commands. Very weak and deconditioned.   Objective: Vitals:   02/24/19 0400 02/24/19 0500 02/24/19 0600 02/24/19 0700  BP: 97/65 103/62 95/72   Pulse:  77 74   Resp: 19 (!) 23 (!) 21   Temp: 97.6 F (36.4 C)   97.9 F (36.6 C)  TempSrc: Axillary   Oral  SpO2:  100% 100%   Weight:  91.9 kg    Height:        Intake/Output Summary (Last 24 hours) at 02/24/2019 0809 Last data filed at 02/24/2019 0300 Gross per 24 hour  Intake 684.54 ml  Output 275 ml  Net 409.54 ml   Filed Weights   2019-03-05 1600 02/23/19 0430 02/24/19 0500  Weight: 92.1 kg 93.6 kg 91.9 kg    Examination:   General: deconditioned and ill looking appearing  Neurology: Awake, slow speech, left upper extremity is 2/5, right upper and lower extremities 4/5.  E ENT: mild pallor, no icterus, oral mucosa dry Cardiovascular: No JVD. S1-S2 present, irregularly irregular, with no gallops, rubs, or murmurs. No lower extremity edema. Pulmonary: positive breath sounds bilaterally, adequate air movement, no wheezing, rhonchi or rales. Gastrointestinal. Abdomen with no organomegaly, non tender, no rebound or guarding Skin. No rashes Musculoskeletal: no joint deformities     Data Reviewed: I have personally reviewed following labs and imaging studies  CBC: Recent Labs  Lab 03/05/19 1135 02/23/19 0425 02/24/19 0407  WBC 26.6* 28.4* 23.8*  HGB 8.6* 7.6* 8.9*  HCT 26.6* 23.6* 28.6*  MCV 113.7* 113.5* 117.2*  PLT 37* 34* 21*   Basic Metabolic Panel: Recent Labs  Lab Mar 05, 2019 1135 05-Mar-2019 1729 02/23/19 0425 02/24/19 0407  NA 129*  --  130* 129*  K 4.1  --  3.7 3.8  CL 96*  --  100 99  CO2 20*  --  18* 18*  GLUCOSE 421*  --  231* 181*   BUN 49*  --  52* 59*  CREATININE 2.13*  --  2.19* 2.39*  CALCIUM 7.5*  --  7.3* 7.4*  MG  --  1.2*  --   --    GFR: Estimated Creatinine Clearance: 33.9 mL/min (A) (by C-G formula based on SCr of 2.39 mg/dL (H)). Liver Function Tests: Recent Labs  Lab 03/05/2019 1148 02/24/19 0407  AST 35 31  ALT 19 17  ALKPHOS 137* 123  BILITOT 4.6* 4.0*  PROT 7.3 6.8  ALBUMIN 1.9* 1.8*   No results for input(s): LIPASE, AMYLASE in the last 168 hours. Recent Labs  Lab 02/24/19 0407  AMMONIA 25   Coagulation Profile: Recent Labs  Lab 02/23/19 1142  INR 1.8*   Cardiac Enzymes: No results for input(s): CKTOTAL, CKMB, CKMBINDEX, TROPONINI in the last 168 hours. BNP (last 3 results) No results for input(s): PROBNP in the last 8760 hours. HbA1C: Recent Labs    Mar 05, 2019 1727  HGBA1C 6.8*   CBG: Recent Labs  Lab 03/05/19 2102 02/23/19 0741 02/23/19 1125 02/23/19 1648 02/23/19 2117  GLUCAP 362* 232* 206* 222* 185*   Lipid  Profile: No results for input(s): CHOL, HDL, LDLCALC, TRIG, CHOLHDL, LDLDIRECT in the last 72 hours. Thyroid Function Tests: Recent Labs    02/21/2019 1730  TSH 4.865*   Anemia Panel: No results for input(s): VITAMINB12, FOLATE, FERRITIN, TIBC, IRON, RETICCTPCT in the last 72 hours.    Radiology Studies: I have reviewed all of the imaging during this hospital visit personally     Scheduled Meds: . atorvastatin  80 mg Oral q1800  . Chlorhexidine Gluconate Cloth  6 each Topical Daily  . folic acid  1 mg Oral Daily  . furosemide  20 mg Intravenous Q12H  . glipiZIDE  2.5 mg Oral BID AC  . insulin aspart  0-5 Units Subcutaneous QHS  . insulin aspart  0-9 Units Subcutaneous TID WC  . metoprolol tartrate  25 mg Oral BID  . multivitamin with minerals  1 tablet Oral Daily  . sodium chloride flush  3 mL Intravenous Q12H  . thiamine  100 mg Oral Daily   Continuous Infusions: . sodium chloride 5 mL/hr at 02/24/19 0300  . cefTRIAXone (ROCEPHIN)  IV    .  levETIRAcetam    . vancomycin       LOS: 1 day        Mauricio Annett Gulaaniel Arrien, MD

## 2019-02-24 NOTE — Progress Notes (Signed)
CRITICAL VALUE ALERT  Critical Value:  Platelets  Date & Time Notied:  02/24/19 @ 0512  Provider Notified: Dr. Jonette Eva  Orders Received/Actions taken: Waiting for orders/call back.

## 2019-02-25 ENCOUNTER — Inpatient Hospital Stay (HOSPITAL_COMMUNITY)
Admit: 2019-02-25 | Discharge: 2019-02-25 | Disposition: A | Discharge status: Home / Self Care | Payer: Medicaid Other | Attending: Neurology | Admitting: Neurology

## 2019-02-25 ENCOUNTER — Inpatient Hospital Stay (HOSPITAL_COMMUNITY): Payer: Medicaid Other

## 2019-02-25 DIAGNOSIS — D696 Thrombocytopenia, unspecified: Secondary | ICD-10-CM | POA: Diagnosis present

## 2019-02-25 DIAGNOSIS — G9340 Encephalopathy, unspecified: Secondary | ICD-10-CM | POA: Diagnosis present

## 2019-02-25 DIAGNOSIS — E872 Acidosis, unspecified: Secondary | ICD-10-CM | POA: Diagnosis present

## 2019-02-25 DIAGNOSIS — A419 Sepsis, unspecified organism: Secondary | ICD-10-CM | POA: Diagnosis present

## 2019-02-25 DIAGNOSIS — R7881 Bacteremia: Secondary | ICD-10-CM | POA: Diagnosis present

## 2019-02-25 DIAGNOSIS — E1165 Type 2 diabetes mellitus with hyperglycemia: Secondary | ICD-10-CM

## 2019-02-25 DIAGNOSIS — I69391 Dysphagia following cerebral infarction: Secondary | ICD-10-CM

## 2019-02-25 DIAGNOSIS — D65 Disseminated intravascular coagulation [defibrination syndrome]: Secondary | ICD-10-CM | POA: Diagnosis present

## 2019-02-25 DIAGNOSIS — K7030 Alcoholic cirrhosis of liver without ascites: Secondary | ICD-10-CM | POA: Diagnosis present

## 2019-02-25 DIAGNOSIS — B957 Other staphylococcus as the cause of diseases classified elsewhere: Secondary | ICD-10-CM | POA: Diagnosis present

## 2019-02-25 LAB — BASIC METABOLIC PANEL
Anion gap: 14 (ref 5–15)
Anion gap: 15 (ref 5–15)
BUN: 74 mg/dL — ABNORMAL HIGH (ref 8–23)
BUN: 75 mg/dL — ABNORMAL HIGH (ref 8–23)
CO2: 16 mmol/L — ABNORMAL LOW (ref 22–32)
CO2: 17 mmol/L — ABNORMAL LOW (ref 22–32)
Calcium: 7.4 mg/dL — ABNORMAL LOW (ref 8.9–10.3)
Calcium: 7.1 mg/dL — ABNORMAL LOW (ref 8.9–10.3)
Chloride: 100 mmol/L (ref 98–111)
Chloride: 100 mmol/L (ref 98–111)
Creatinine, Ser: 3.15 mg/dL — ABNORMAL HIGH (ref 0.61–1.24)
Creatinine, Ser: 3.6 mg/dL — ABNORMAL HIGH (ref 0.61–1.24)
GFR calc Af Amer: 22 mL/min — ABNORMAL LOW (ref >60)
GFR calc Af Amer: 19 mL/min — ABNORMAL LOW (ref >60)
GFR calc non Af Amer: 19 mL/min — ABNORMAL LOW (ref >60)
GFR calc non Af Amer: 16 mL/min — ABNORMAL LOW (ref >60)
Glucose, Bld: 233 mg/dL — ABNORMAL HIGH (ref 70–99)
Glucose, Bld: 217 mg/dL — ABNORMAL HIGH (ref 70–99)
Potassium: 4.7 mmol/L (ref 3.5–5.1)
Potassium: 4.4 mmol/L (ref 3.5–5.1)
Sodium: 130 mmol/L — ABNORMAL LOW (ref 135–145)
Sodium: 132 mmol/L — ABNORMAL LOW (ref 135–145)

## 2019-02-25 LAB — DIC (DISSEMINATED INTRAVASCULAR COAGULATION)PANEL
D-Dimer, Quant: 18.45 ug/mL-FEU — ABNORMAL HIGH (ref 0.00–0.50)
Fibrinogen: 143 mg/dL — ABNORMAL LOW (ref 210–475)
INR: 1.9 — ABNORMAL HIGH (ref 0.8–1.2)
Platelets: 14 10*3/uL — CL (ref 150–400)
Prothrombin Time: 21.8 seconds — ABNORMAL HIGH (ref 11.4–15.2)
Smear Review: NONE SEEN
aPTT: 49 seconds — ABNORMAL HIGH (ref 24–36)

## 2019-02-25 LAB — LACTIC ACID, PLASMA
Lactic Acid, Venous: 2.7 mmol/L (ref 0.5–1.9)
Lactic Acid, Venous: 3.2 mmol/L (ref 0.5–1.9)
Lactic Acid, Venous: 3.2 mmol/L (ref 0.5–1.9)

## 2019-02-25 LAB — CBC WITH DIFFERENTIAL/PLATELET
Abs Immature Granulocytes: 0.58 10*3/uL — ABNORMAL HIGH (ref 0.00–0.07)
Basophils Absolute: 0.1 10*3/uL (ref 0.0–0.1)
Basophils Relative: 0 %
Eosinophils Absolute: 0 10*3/uL (ref 0.0–0.5)
Eosinophils Relative: 0 %
HCT: 27.4 % — ABNORMAL LOW (ref 39.0–52.0)
Hemoglobin: 8.7 g/dL — ABNORMAL LOW (ref 13.0–17.0)
Immature Granulocytes: 2 %
Lymphocytes Relative: 10 %
Lymphs Abs: 3 10*3/uL (ref 0.7–4.0)
MCH: 36.7 pg — ABNORMAL HIGH (ref 26.0–34.0)
MCHC: 31.8 g/dL (ref 30.0–36.0)
MCV: 115.6 fL — ABNORMAL HIGH (ref 80.0–100.0)
Monocytes Absolute: 1.5 10*3/uL — ABNORMAL HIGH (ref 0.1–1.0)
Monocytes Relative: 5 %
Neutro Abs: 24.3 10*3/uL — ABNORMAL HIGH (ref 1.7–7.7)
Neutrophils Relative %: 83 %
Platelets: 9 10*3/uL — CL (ref 150–400)
RBC: 2.37 MIL/uL — ABNORMAL LOW (ref 4.22–5.81)
RDW: 15.7 % — ABNORMAL HIGH (ref 11.5–15.5)
WBC: 29.5 10*3/uL — ABNORMAL HIGH (ref 4.0–10.5)
nRBC: 0 % (ref 0.0–0.2)

## 2019-02-25 LAB — CULTURE, BLOOD (ROUTINE X 2)
Special Requests: ADEQUATE
Special Requests: ADEQUATE

## 2019-02-25 LAB — GLUCOSE, CAPILLARY
Glucose-Capillary: 231 mg/dL — ABNORMAL HIGH (ref 70–99)
Glucose-Capillary: 217 mg/dL — ABNORMAL HIGH (ref 70–99)
Glucose-Capillary: 195 mg/dL — ABNORMAL HIGH (ref 70–99)
Glucose-Capillary: 207 mg/dL — ABNORMAL HIGH (ref 70–99)
Glucose-Capillary: 207 mg/dL — ABNORMAL HIGH (ref 70–99)

## 2019-02-25 LAB — SAVE SMEAR(SSMR), FOR PROVIDER SLIDE REVIEW

## 2019-02-25 LAB — CORTISOL: Cortisol, Plasma: 25.5 ug/dL

## 2019-02-25 LAB — ABO/RH: ABO/RH(D): A POS

## 2019-02-25 LAB — MAGNESIUM: Magnesium: 1.3 mg/dL — ABNORMAL LOW (ref 1.7–2.4)

## 2019-02-25 LAB — LACTATE DEHYDROGENASE: LDH: 279 U/L — ABNORMAL HIGH (ref 98–192)

## 2019-02-25 LAB — PROTIME-INR
INR: 2 — ABNORMAL HIGH (ref 0.8–1.2)
Prothrombin Time: 22 seconds — ABNORMAL HIGH (ref 11.4–15.2)

## 2019-02-25 LAB — HIV-1 RNA QUANT-NO REFLEX-BLD
HIV 1 RNA Quant: <20 copies/mL
LOG10 HIV-1 RNA: UNDETERMINED log10copy/mL

## 2019-02-25 MED ORDER — SODIUM CHLORIDE 0.9 % IV BOLUS (SEPSIS)
1000.0000 mL | Freq: Once | INTRAVENOUS | Status: AC
  Administered 2019-02-25: 1000 mL via INTRAVENOUS

## 2019-02-25 MED ORDER — SODIUM CHLORIDE 0.9 % IV SOLN
250.0000 mL | INTRAVENOUS | Status: DC
  Administered 2019-02-25: 250 mL via INTRAVENOUS

## 2019-02-25 MED ORDER — CEFAZOLIN SODIUM-DEXTROSE 2-4 GM/100ML-% IV SOLN
2.0000 g | Freq: Two times a day (BID) | INTRAVENOUS | Status: DC
  Administered 2019-02-25 – 2019-02-28 (×6): 2 g via INTRAVENOUS
  Filled 2019-02-25 (×7): qty 100

## 2019-02-25 MED ORDER — THIAMINE HCL 100 MG/ML IJ SOLN
100.0000 mg | Freq: Every day | INTRAMUSCULAR | Status: DC
  Administered 2019-02-26 – 2019-02-27 (×2): 100 mg via INTRAVENOUS
  Filled 2019-02-25 (×2): qty 2

## 2019-02-25 MED ORDER — NOREPINEPHRINE 4 MG/250ML-% IV SOLN
0.0000 ug/min | INTRAVENOUS | Status: DC
  Filled 2019-02-25: qty 250

## 2019-02-25 MED ORDER — PHENYLEPHRINE HCL-NACL 10-0.9 MG/250ML-% IV SOLN
25.0000 ug/min | INTRAVENOUS | Status: DC
  Filled 2019-02-25: qty 250

## 2019-02-25 MED ORDER — SODIUM BICARBONATE 8.4 % IV SOLN
INTRAVENOUS | Status: DC
  Administered 2019-02-25 (×2): via INTRAVENOUS
  Filled 2019-02-25 (×3): qty 1000

## 2019-02-25 MED ORDER — FAMOTIDINE IN NACL 20-0.9 MG/50ML-% IV SOLN
20.0000 mg | INTRAVENOUS | Status: DC
  Administered 2019-02-26 – 2019-02-28 (×3): 20 mg via INTRAVENOUS
  Filled 2019-02-25 (×3): qty 50

## 2019-02-25 MED ORDER — FOLIC ACID 5 MG/ML IJ SOLN
1.0000 mg | Freq: Every day | INTRAMUSCULAR | Status: DC
  Administered 2019-02-26 – 2019-02-27 (×2): 1 mg via INTRAVENOUS
  Filled 2019-02-25 (×2): qty 0.2

## 2019-02-25 MED ORDER — SODIUM CHLORIDE 0.9 % IV BOLUS
500.0000 mL | Freq: Once | INTRAVENOUS | Status: AC
  Administered 2019-02-25: 500 mL via INTRAVENOUS

## 2019-02-25 MED ORDER — INSULIN ASPART 100 UNIT/ML ~~LOC~~ SOLN
2.0000 [IU] | SUBCUTANEOUS | Status: DC
  Administered 2019-02-25: 6 [IU] via SUBCUTANEOUS
  Administered 2019-02-26: 4 [IU] via SUBCUTANEOUS
  Administered 2019-02-26: 2 [IU] via SUBCUTANEOUS
  Administered 2019-02-26: 4 [IU] via SUBCUTANEOUS
  Administered 2019-02-26: 2 [IU] via SUBCUTANEOUS
  Administered 2019-02-26: 4 [IU] via SUBCUTANEOUS
  Administered 2019-02-26: 6 [IU] via SUBCUTANEOUS
  Administered 2019-02-27: 4 [IU] via SUBCUTANEOUS
  Administered 2019-02-27: 6 [IU] via SUBCUTANEOUS

## 2019-02-25 MED ORDER — SODIUM CHLORIDE 0.9% IV SOLUTION
Freq: Once | INTRAVENOUS | Status: AC
  Administered 2019-02-25: 09:00:00 via INTRAVENOUS

## 2019-02-25 MED ORDER — MAGNESIUM SULFATE 2 GM/50ML IV SOLN
2.0000 g | Freq: Once | INTRAVENOUS | Status: AC
  Administered 2019-02-25: 2 g via INTRAVENOUS
  Filled 2019-02-25: qty 50

## 2019-02-25 NOTE — Progress Notes (Signed)
ID PROGRESS NOTE  68yo M with afib, DM, HTN, admitted on 11/9 with SOB, fever, found to have afib with RVR with hyperglycemia of >500. Labs also found several other abn with WBC of 26K, PLT of 34, plus AKi with Cr of 2. Infectious work up revealed strep epidermidis (oxa S) in 4/4 bottles. He was started on vanco and ceftriaxone empirically. His TTE on 11/10 showed large partially mobile vegetation to anterior leaflet of TV measuring 1.8 x 2.4cm but mild TR. He has clinically worsened in the last 24 hr, becoming hypotensive and worsening thrombocytopenia, likely from underlying infection  - recommend to switch abtx to cefazolin and renally dose abtx. Repeat blood cx pending  - consider to transfer to [facility] so that can also undergo TEE (when stable) but more importantly evaluated by Dr lightfoot and CT surgery team for TV vegectomy  - HIV labs (screening appears positive but hiv ab negative)  = awaiting confirmatory testing with viral load to ensure it is negative  aki = will discontinue vancomycin in case contributing to worsening renal function  Will provide further recs tomorrow

## 2019-02-25 NOTE — Progress Notes (Signed)
CRITICAL VALUE ALERT  Critical Value:  Lactic acid 3.2  Date & Time Notified:  02/26/19 2300  Provider Notified: Warren Lacy  Orders Received/Actions taken: 541mL bolus, recheck lactic acid.

## 2019-02-25 NOTE — Progress Notes (Signed)
PROGRESS NOTE    Luke Garcia  ZOX:[id number] DOB: [birth year] DOA: 02/16/2019 PCP: Patient, No Pcp Per    Brief Narrative:  68 year old male with a history of alcohol use, diabetes, hypertension presented to the emergency room with complaints of shortness of breath.  He was found to be in rapid atrial fibrillation.  He was started on Cardizem infusion.  Further work-up with 2D echocardiogram showed evidence of tricuspid valve vegetation.  Blood cultures were subsequently checked and found to be positive for Staphylococcus epidermidis.  He was started on vancomycin and ceftriaxone.  Hospital course was further complicated by acute change in mental status with dysarthria.  MRI brain indicated acute CVA.  He was seen by neurology, but was not a candidate for antiplatelet agents or TPA due to severe thrombocytopenia.  It was felt that his atrial fibrillation was likely contributing to his stroke.  He is noted to have worsening renal failure since admission.  Abdominal imaging did not indicate any significant hydronephrosis.  It was reported that he had evidence of cirrhosis.  The patient is also had worsening thrombocytopenia.  Unclear have his baseline platelet count in the setting of cirrhosis, but it has declined since admission.  Suspect that he has an acute component, possibly related to DIC.  No schistocytes on smear.  Patient was noted to have progressive hypotension and was transferred to the ICU.  He was started on IV fluid bolus per sepsis protocol and lactic acid will be checked.  Case was reviewed with Dr. Drue Second on-call for infectious disease who has adjusted antibiotics to Ancef.  It was also recommended that patient be transferred to [facility] for further evaluation by cardiothoracic surgery.  Case was reviewed with Dr. Chestine Spore on call for critical care medicine who agrees with transfer to the ICU at [facility].  If hypotension persists despite IV fluid bolus, may need to start the patient on  Levophed.  Per Dr. Chestine Spore, this could potentially be administered through a large bore peripheral IV if central access not available.   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  Active Problems:   DM (diabetes mellitus), type 2, uncontrolled with complications (HCC)   A-fib, unknown duration   HTN (hypertension)   Weakness generalized   Leukocytosis   Acute lower UTI   CVA (cerebral vascular accident)/Acute Cerebella Stroke   HIV (human immunodeficiency virus infection) -- Needs Further confirmation and w/u by ID    Endocarditis   AKI (acute kidney injury) (HCC)   Staphylococcus epidermidis bacteremia   Alcoholic cirrhosis of liver without ascites (HCC)   Thrombocytopenia (HCC)   DIC (disseminated intravascular coagulation) (HCC)   Dysphagia following cerebral infarction   Metabolic acidosis   Acute encephalopathy   1. Staphylococcal sepsis.  Blood cultures positive for staph epidermidis.  Patient has been hypotensive today.  Beta-blockers have been held.  Checking lactic acid.  We will bolus IV fluids per sepsis protocol since the patient is hypotensive.  If he does not respond to this, he may need vasopressors. 2. Tricuspid valve endocarditis.  Vegetation noted on 2D echocardiogram.  Currently on intravenous vancomycin and ceftriaxone.  Case reviewed with Dr. Drue Second who recommends changing antibiotics to Ancef based on culture sensitivities.  She is also recommending that patient be transferred to [facility] for further evaluation by cardiothoracic surgery. 3. Severe thrombocytopenia.  Possibly multifactorial in setting of cirrhosis.  Likely has an acute component of  DIC with low fibrinogen and high D-dimer.  No schistocytes noted on smear.  1 unit of platelets has been ordered for today. 4. Acute kidney injury.  Likely related to volume depletion in setting of sepsis.  He recently had abdominal ultrasound did not show any  hydronephrosis.  Continue volume resuscitation and monitor urine output. 5. Acute CVA.  Noted on MRI imaging.  Seen by neurology.  Not a candidate for antiplatelet agents due to severe thrombocytopenia.  He does have underlying A. fib and can be considered for anticoagulation should his platelet count improve. 6. Dysphagia.  Noted to be quite significant on barium swallow.  Currently n.p.o.  Speech therapy following. 7. HIV test screen positive.  Discussed with infectious disease.  Waiting on quantitative RNA for confirmation 8. Atrial fibrillation with rapid ventricular response.  Started on beta-blockers with improvement.  Not a candidate for anticoagulation due to severe thrombocytopenia. 9. Acute encephalopathy.  Suspect multifactorial in the setting of stroke as well as sepsis and hypotension.  Continue to monitor. 10. Cirrhosis, likely related to history of alcohol use.  Ammonia was checked and found to be normal.  He does not have any evidence of abdominal pain.  Noted to have small volume ascites on imaging. 11. Diabetes.  Oral agents currently on hold.  On sliding scale insulin. 12. Goals of care.  With multiple medical problems, his prognosis remains poor.  His condition was reviewed with his son, Luke Garcia who lives in [place].  I explained the severity of his illness, complicated by his underlying comorbidities.  We discussed his CODE STATUS and having a DNR status would be very reasonable.  His son needs to discuss this with the remainder of the family prior to coming any decisions.  The patient does not have any designated healthcare power of attorney.  Palliative care consulted to assist with conversations around goals.   DVT prophylaxis: SCDs Code Status: Full code Family Communication: Discussed with patient's son over the phone Disposition Plan: [facility] ICU [facility] care.  Family is aware and agrees.   Consultants:   Neurology  Cardiology  Infectious disease  (phone)  Procedures:    Antimicrobials:   Ceftriaxone 11/9 > 11/12  Vancomycin 11/9 > 11/12  Ancef 11/12 >   Subjective: Patient is confused.  History is unreliable.  Denies any pain at this time.  Objective: Vitals:   02/25/19 1230 02/25/19 1302 02/25/19 1332 02/25/19 1700  BP: (!) 84/54 (!) 79/48 (!) 71/50 (!) 80/58  Pulse: 63 61 73 88  Resp: 16 20 18 18   Temp: 97.9 F (36.6 C)   97.6 F (36.4 C)  TempSrc:    Axillary  SpO2: 98% 99% 97% 100%  Weight:      Height:        Intake/Output Summary (Last 24 hours) at 02/25/2019 1713 Last data filed at 02/25/2019 1300 Gross per 24 hour  Intake 334.47 ml  Output 800 ml  Net -465.53 ml   Filed Weights   02/23/19 0430 02/24/19 0500 02/25/19 0500  Weight: 93.6 kg 91.9 kg 95.7 kg    Examination:  General exam: Appears calm and comfortable  Respiratory system: Clear to auscultation. Respiratory effort normal. Cardiovascular system: S1 & S2 heard, RRR. No JVD, murmurs, rubs, gallops or clicks. No pedal edema. Gastrointestinal system: Abdomen is nondistended, soft and nontender. No organomegaly or masses felt. Normal bowel sounds heard. Central nervous system: Awake, confused, speech is dysarthric, no facial asymmetry, limited exam due to  patient participation Extremities: Symmetric 5 x 5 power. Skin: Bruising noted on extremities bilaterally Psychiatry: Confused    Data Reviewed: I have personally reviewed following labs and imaging studies  CBC: Recent Labs  Lab 02/25/2019 1135 02/23/19 0425 02/24/19 0407 02/25/19 0530 02/25/19 1119  WBC 26.6* 28.4* 23.8* 29.5*  --   NEUTROABS  --   --   --  24.3*  --   HGB 8.6* 7.6* 8.9* 8.7*  --   HCT 26.6* 23.6* 28.6* 27.4*  --   MCV 113.7* 113.5* 117.2* 115.6*  --   PLT 37* 34* 21* 9* 14*   Basic Metabolic Panel: Recent Labs  Lab 02/21/2019 1135 02/20/2019 1729 02/23/19 0425 02/24/19 0407 02/25/19 0530  NA 129*  --  130* 129* 130*  K 4.1  --  3.7 3.8 4.7  CL 96*   --  100 99 100  CO2 20*  --  18* 18* 16*  GLUCOSE 421*  --  231* 181* 233*  BUN 49*  --  52* 59* 74*  CREATININE 2.13*  --  2.19* 2.39* 3.15*  CALCIUM 7.5*  --  7.3* 7.4* 7.4*  MG  --  1.2*  --   --   --    GFR: Estimated Creatinine Clearance: 25.7 mL/min (A) (by C-G formula based on SCr of 3.15 mg/dL (H)). Liver Function Tests: Recent Labs  Lab 03/02/2019 1148 02/24/19 0407  AST 35 31  ALT 19 17  ALKPHOS 137* 123  BILITOT 4.6* 4.0*  PROT 7.3 6.8  ALBUMIN 1.9* 1.8*   No results for input(s): LIPASE, AMYLASE in the last 168 hours. Recent Labs  Lab 02/24/19 0407  AMMONIA 25   Coagulation Profile: Recent Labs  Lab 02/23/19 1142 02/25/19 0948 02/25/19 1119  INR 1.8* 2.0* 1.9*   Cardiac Enzymes: No results for input(s): CKTOTAL, CKMB, CKMBINDEX, TROPONINI in the last 168 hours. BNP (last 3 results) No results for input(s): PROBNP in the last 8760 hours. HbA1C: Recent Labs    03/04/2019 1727  HGBA1C 6.8*   CBG: Recent Labs  Lab 02/24/19 1602 02/24/19 2141 02/25/19 0808 02/25/19 1106 02/25/19 1609  GLUCAP 206* 232* 231* 217* 195*   Lipid Profile: No results for input(s): CHOL, HDL, LDLCALC, TRIG, CHOLHDL, LDLDIRECT in the last 72 hours. Thyroid Function Tests: Recent Labs    03/08/2019 1730  TSH 4.865*   Anemia Panel: No results for input(s): VITAMINB12, FOLATE, FERRITIN, TIBC, IRON, RETICCTPCT in the last 72 hours. Sepsis Labs: No results for input(s): PROCALCITON, LATICACIDVEN in the last 168 hours.  Recent Results (from the past 240 hour(s))  SARS CORONAVIRUS 2 (TAT 6-24 HRS) Nasopharyngeal Nasopharyngeal Swab     Status: None   Collection Time: 03/08/2019 11:43 AM   Specimen: Nasopharyngeal Swab  Result Value Ref Range Status   SARS Coronavirus 2 NEGATIVE NEGATIVE Final    Comment: (NOTE) SARS-CoV-2 target nucleic acids are NOT DETECTED. The SARS-CoV-2 RNA is generally detectable in upper and lower respiratory specimens during the acute phase of  infection. Negative results do not preclude SARS-CoV-2 infection, do not rule out co-infections with other pathogens, and should not be used as the sole basis for treatment or other patient management decisions. Negative results must be combined with clinical observations, patient history, and epidemiological information. The expected result is Negative. Fact Sheet for Patients: HairSlick.no Fact Sheet for Healthcare Providers: quierodirigir.com This test is not yet approved or cleared by the [place] FDA and  has been authorized for detection and/or diagnosis of  SARS-CoV-2 by FDA under an Emergency Use Authorization (EUA). This EUA will remain  in effect (meaning this test can be used) for the duration of the COVID-19 declaration under Section 56 4(b)(1) of the Act, 21 U.S.C. section 360bbb-3(b)(1), unless the authorization is terminated or revoked sooner. Performed at [facility], [address], [place], [place] [address]   MRSA PCR Screening     Status: None   Collection Time: 03/21/2019  4:04 PM   Specimen: Nasal Mucosa; Nasopharyngeal  Result Value Ref Range Status   MRSA by PCR NEGATIVE NEGATIVE Final    Comment:        The GeneXpert MRSA Assay (FDA approved for NASAL specimens only), is one component of a comprehensive MRSA colonization surveillance program. It is not intended to diagnose MRSA infection nor to guide or monitor treatment for MRSA infections. Performed at [facility], [address], [place], [place] [address]   Culture, blood (Routine X 2) w Reflex to ID Panel     Status: Abnormal   Collection Time: 2019-03-21  9:03 PM   Specimen: BLOOD  Result Value Ref Range Status   Specimen Description   Final    BLOOD RIGHT ANTECUBITAL Performed at [facility], [address] [place], [place] [address]    Special Requests   Final    BOTTLES DRAWN AEROBIC AND ANAEROBIC Blood Culture adequate  volume Performed at [facility], [address], [place], [place] [address]    Culture  Setup Time   Final    GRAM POSITIVE COCCI IN BOTH AEROBIC AND ANAEROBIC BOTTLES PREVIOUSLY CALLED OTHER SET AT 1108A ON 111020 TO HYLTON L. BY THOMPSON S. Performed at [facility], [address], [place], [place] [address]    Culture (A)  Final    STAPHYLOCOCCUS EPIDERMIDIS SUSCEPTIBILITIES PERFORMED ON PREVIOUS CULTURE WITHIN THE LAST 5 DAYS. Performed at [facility], [address] [place], [place] [address]    Report Status 02/25/2019 FINAL  Final  Culture, blood (Routine X 2) w Reflex to ID Panel     Status: Abnormal   Collection Time: 2019-03-21  9:08 PM   Specimen: BLOOD RIGHT HAND  Result Value Ref Range Status   Specimen Description   Final    BLOOD RIGHT HAND Performed at [facility], [address] [place], [place] [address]    Special Requests   Final    BOTTLES DRAWN AEROBIC AND ANAEROBIC Blood Culture adequate volume Performed at [facility], [address] [place], [place] [address]    Culture  Setup Time   Final    GRAM POSITIVE COCCI IN BOTH AEROBIC AND ANAEROBIC BOTTLES Gram Stain Report Called to,Read Back By and Verified With: HYLTON L. AT 1108A ON 111020 BY THOMPSON S. Organism ID to follow CRITICAL RESULT CALLED TO, READ BACK BY AND VERIFIED WITH: H TETREAULT RN 02/23/19 1936 JDW Performed at [facility], [address], [place], [place] [address]    Culture STAPHYLOCOCCUS EPIDERMIDIS (A)  Final   Report Status 02/25/2019 FINAL  Final   Organism ID, Bacteria STAPHYLOCOCCUS EPIDERMIDIS  Final      Susceptibility   Staphylococcus epidermidis - MIC*    CIPROFLOXACIN <=0.5 SENSITIVE Sensitive     ERYTHROMYCIN >=8 RESISTANT Resistant     GENTAMICIN <=0.5 SENSITIVE Sensitive     OXACILLIN <=0.25 SENSITIVE Sensitive     TETRACYCLINE >=16 RESISTANT Resistant     VANCOMYCIN 1 SENSITIVE Sensitive     TRIMETH/SULFA <=10 SENSITIVE Sensitive  CLINDAMYCIN <=0.25  SENSITIVE Sensitive     RIFAMPIN <=0.5 SENSITIVE Sensitive     Inducible Clindamycin NEGATIVE Sensitive     * STAPHYLOCOCCUS EPIDERMIDIS  Blood Culture ID Panel (Reflexed)     Status: Abnormal   Collection Time: 2019/03/20  9:08 PM  Result Value Ref Range Status   Enterococcus species NOT DETECTED NOT DETECTED Final   Listeria monocytogenes NOT DETECTED NOT DETECTED Final   Staphylococcus species DETECTED (A) NOT DETECTED Final    Comment: Methicillin (oxacillin) susceptible coagulase negative staphylococcus. Possible blood culture contaminant (unless isolated from more than one blood culture draw or clinical case suggests pathogenicity). No antibiotic treatment is indicated for blood  culture contaminants. CRITICAL RESULT CALLED TO, READ BACK BY AND VERIFIED WITH: H TETREAULT RN 02/23/19 1936 JDW    Staphylococcus aureus (BCID) NOT DETECTED NOT DETECTED Final   Methicillin resistance NOT DETECTED NOT DETECTED Final   Streptococcus species NOT DETECTED NOT DETECTED Final   Streptococcus agalactiae NOT DETECTED NOT DETECTED Final   Streptococcus pneumoniae NOT DETECTED NOT DETECTED Final   Streptococcus pyogenes NOT DETECTED NOT DETECTED Final   Acinetobacter baumannii NOT DETECTED NOT DETECTED Final   Enterobacteriaceae species NOT DETECTED NOT DETECTED Final   Enterobacter cloacae complex NOT DETECTED NOT DETECTED Final   Escherichia coli NOT DETECTED NOT DETECTED Final   Klebsiella oxytoca NOT DETECTED NOT DETECTED Final   Klebsiella pneumoniae NOT DETECTED NOT DETECTED Final   Proteus species NOT DETECTED NOT DETECTED Final   Serratia marcescens NOT DETECTED NOT DETECTED Final   Haemophilus influenzae NOT DETECTED NOT DETECTED Final   Neisseria meningitidis NOT DETECTED NOT DETECTED Final   Pseudomonas aeruginosa NOT DETECTED NOT DETECTED Final   Candida albicans NOT DETECTED NOT DETECTED Final   Candida glabrata NOT DETECTED NOT DETECTED Final   Candida krusei NOT DETECTED NOT  DETECTED Final   Candida parapsilosis NOT DETECTED NOT DETECTED Final   Candida tropicalis NOT DETECTED NOT DETECTED Final    Comment: Performed at [facility], [address] [place], [place] [address]  Culture, blood (Routine X 2) w Reflex to ID Panel     Status: None (Preliminary result)   Collection Time: 02/24/19  1:17 PM   Specimen: BLOOD RIGHT ARM  Result Value Ref Range Status   Specimen Description BLOOD RIGHT ARM  Final   Special Requests   Final    BOTTLES DRAWN AEROBIC AND ANAEROBIC Blood Culture adequate volume   Culture   Final    NO GROWTH < 24 HOURS Performed at [facility], [address] [place], [place] [address]    Report Status PENDING  Incomplete  Culture, blood (Routine X 2) w Reflex to ID Panel     Status: None (Preliminary result)   Collection Time: 02/24/19  1:25 PM   Specimen: BLOOD RIGHT HAND  Result Value Ref Range Status   Specimen Description BLOOD RIGHT HAND  Final   Special Requests   Final    BOTTLES DRAWN AEROBIC ONLY Blood Culture results may not be optimal due to an inadequate volume of blood received in culture bottles   Culture   Final    NO GROWTH < 24 HOURS Performed at [facility], [address] [place], [place] [address]    Report Status PENDING  Incomplete         Radiology Studies: Dg Swallowing Func-speech Pathology  Result Date: 02/24/2019 Objective Swallowing Evaluation: Type of Study: MBS-Modified Barium Swallow Study  Patient Details Name: Kurtis  Johnette Abraham Line MRN: [id number] Date of Birth: 09-09-1950 Today's Date: 02/24/2019 Time: SLP Start Time (ACUTE ONLY): 1235 -SLP Stop Time (ACUTE ONLY): 1253 SLP Time Calculation (min) (ACUTE ONLY): 18 min Past Medical History: Past Medical History: Diagnosis Date  Atrial fibrillation ([place])   Diabetes mellitus without complication ([place])   Hypertension  Past Surgical History: No past surgical history on file. HPI: Julez Huseby  is a 68 y.o. male with past medical history relevant for prior  history of alcohol abuse, patient denies current use, hypertension and DM who  lives in camper behind friends house-presents to the ED with shortness of breath and noted to have multiple bruises on body. MRI reveals "There is a punctate focus of restricted diffusion in the right cerebellar hemisphere reflecting acute infarct." BSE ordered to evaluate swallowing function.  No data recorded Assessment / Plan / Recommendation CHL IP CLINICAL IMPRESSIONS 02/24/2019 Clinical Impression Pt presents with moderately severe oropharyngeal dysphagia; Pt presents with significant lethargy today which is a change from presentation during bedside evaluation yesterday and despite mod/max verbal and tactile cues Pt had difficulty holding his head erect for test. Oral Dysphagia is characterized by decreased awareness of bolus, decreased bolus cohesion and decreased bolus propulsion resulting in anterior spillage of which patient is unaware and premature spillage. Pharyngeal stage is characterized by decreased laryngeal excursion, no epiglottic deflection, decreased pharyngeal sqeeze, and decreased laryngeal vestibule closure all resulting in multiple episodes of SILENT aspiration occuring before, during and after the swallow. Pt was unable to provide cough on command and aspirates were not visualized being cleared from airway. Note moderate/severe residue of all textures in valleculae and mild residue in pyriforms and lateral channels, head turn attempted and was ineffective for decreasing residue. Pt with prolonged oral prep stage with puree with delayed swallow trigger and significant residue noted in valleculae. MBS was terminated without attempting further textures (Regular, HTL, & barium tablet not attempted) secondary to Pts declining alertness as test progressed. Pt is noted to be at Reader for aspiration and recommend NPO pending Pt's alertness/mentation improves also recommend consider repeat MBSS as/ if becomes  clinically appropriate. ST will continue to follow while in acute setting. SLP Visit Diagnosis Dysphagia, unspecified (R13.10) Attention and concentration deficit following -- Frontal lobe and executive function deficit following -- Impact on safety and function Severe aspiration risk   CHL IP TREATMENT RECOMMENDATION 02/23/2019 Treatment Recommendations F/U MBS in --- days (Comment)   Prognosis 02/23/2019 Prognosis for Safe Diet Advancement Good Barriers to Reach Goals Severity of deficits Barriers/Prognosis Comment -- CHL IP DIET RECOMMENDATION 02/24/2019 SLP Diet Recommendations NPO Liquid Administration via Cup;Straw Medication Administration Via alternative means Compensations Minimize environmental distractions;Slow rate;Small sips/bites;Follow solids with liquid;Clear throat intermittently;Hard cough after swallow Postural Changes Seated upright at 90 degrees;Remain semi-upright after after feeds/meals (Comment)   CHL IP OTHER RECOMMENDATIONS 02/24/2019 Recommended Consults -- Oral Care Recommendations Oral care QID Other Recommendations --   No flowsheet data found.  CHL IP FREQUENCY AND DURATION 02/24/2019 Speech Therapy Frequency (ACUTE ONLY) min 1 x/week Treatment Duration --      CHL IP ORAL PHASE 02/24/2019 Oral Phase Impaired Oral - Pudding Teaspoon -- Oral - Pudding Cup -- Oral - Honey Teaspoon -- Oral - Honey Cup -- Oral - Nectar Teaspoon -- Oral - Nectar Cup Left anterior bolus loss;Right anterior bolus loss;Weak lingual manipulation;Lingual pumping;Reduced posterior propulsion;Lingual/palatal residue;Piecemeal swallowing;Delayed oral transit;Decreased bolus cohesion;Premature spillage Oral - Nectar Straw -- Oral - Thin Teaspoon Left anterior bolus loss;Right anterior  bolus loss;Weak lingual manipulation;Lingual pumping;Reduced posterior propulsion;Lingual/palatal residue;Piecemeal swallowing;Delayed oral transit;Decreased bolus cohesion;Premature spillage Oral - Thin Cup Left anterior bolus  loss;Right anterior bolus loss;Weak lingual manipulation;Lingual pumping;Reduced posterior propulsion;Lingual/palatal residue;Piecemeal swallowing;Delayed oral transit;Decreased bolus cohesion;Premature spillage Oral - Thin Straw -- Oral - Puree Left anterior bolus loss;Right anterior bolus loss;Weak lingual manipulation;Lingual pumping;Reduced posterior propulsion;Lingual/palatal residue;Piecemeal swallowing;Delayed oral transit;Decreased bolus cohesion;Premature spillage Oral - Mech Soft -- Oral - Regular -- Oral - Multi-Consistency -- Oral - Pill -- Oral Phase - Comment --  CHL IP PHARYNGEAL PHASE 02/24/2019 Pharyngeal Phase Impaired Pharyngeal- Pudding Teaspoon -- Pharyngeal -- Pharyngeal- Pudding Cup -- Pharyngeal -- Pharyngeal- Honey Teaspoon -- Pharyngeal -- Pharyngeal- Honey Cup -- Pharyngeal -- Pharyngeal- Nectar Teaspoon -- Pharyngeal -- Pharyngeal- Nectar Cup Reduced pharyngeal peristalsis;Reduced epiglottic inversion;Reduced anterior laryngeal mobility;Reduced laryngeal elevation;Reduced airway/laryngeal closure;Reduced tongue base retraction;Penetration/Aspiration before swallow;Penetration/Aspiration during swallow;Moderate aspiration;Trace aspiration;Pharyngeal residue - valleculae;Pharyngeal residue - pyriform;Inter-arytenoid space residue;Lateral channel residue;Compensatory strategies attempted (with notebox) Pharyngeal Material enters airway, passes BELOW cords without attempt by patient to eject out (silent aspiration) Pharyngeal- Nectar Straw -- Pharyngeal -- Pharyngeal- Thin Teaspoon Reduced pharyngeal peristalsis;Reduced epiglottic inversion;Reduced anterior laryngeal mobility;Reduced laryngeal elevation;Reduced airway/laryngeal closure;Reduced tongue base retraction;Penetration/Aspiration before swallow;Penetration/Aspiration during swallow;Moderate aspiration;Trace aspiration;Pharyngeal residue - valleculae;Pharyngeal residue - pyriform;Inter-arytenoid space residue;Lateral channel  residue;Compensatory strategies attempted (with notebox) Pharyngeal Material enters airway, passes BELOW cords without attempt by patient to eject out (silent aspiration) Pharyngeal- Thin Cup Reduced pharyngeal peristalsis;Reduced epiglottic inversion;Reduced anterior laryngeal mobility;Reduced laryngeal elevation;Reduced airway/laryngeal closure;Reduced tongue base retraction;Penetration/Aspiration before swallow;Penetration/Aspiration during swallow;Moderate aspiration;Trace aspiration;Pharyngeal residue - valleculae;Pharyngeal residue - pyriform;Inter-arytenoid space residue;Lateral channel residue;Compensatory strategies attempted (with notebox) Pharyngeal Material enters airway, passes BELOW cords without attempt by patient to eject out (silent aspiration) Pharyngeal- Thin Straw -- Pharyngeal -- Pharyngeal- Puree Reduced pharyngeal peristalsis;Reduced epiglottic inversion;Reduced anterior laryngeal mobility;Reduced laryngeal elevation;Reduced airway/laryngeal closure;Reduced tongue base retraction;Penetration/Aspiration before swallow;Penetration/Aspiration during swallow;Moderate aspiration;Trace aspiration;Pharyngeal residue - valleculae;Pharyngeal residue - pyriform;Inter-arytenoid space residue;Lateral channel residue;Compensatory strategies attempted (with notebox) Pharyngeal Material enters airway, passes BELOW cords without attempt by patient to eject out (silent aspiration) Pharyngeal- Mechanical Soft -- Pharyngeal -- Pharyngeal- Regular -- Pharyngeal -- Pharyngeal- Multi-consistency -- Pharyngeal -- Pharyngeal- Pill -- Pharyngeal -- Pharyngeal Comment --  No flowsheet data found. Amelia H. Romie Levee, CCC-SLP Speech Language Pathologist Georgetta Haber 02/24/2019, 2:33 PM                   Scheduled Meds:  atorvastatin  80 mg Oral q1800   Chlorhexidine Gluconate Cloth  6 each Topical Daily   folic acid  1 mg Oral Daily   insulin aspart  0-5 Units Subcutaneous QHS   insulin aspart   0-9 Units Subcutaneous TID WC   metoprolol tartrate  25 mg Oral BID   multivitamin with minerals  1 tablet Oral Daily   thiamine  100 mg Oral Daily   Continuous Infusions:  cefTRIAXone (ROCEPHIN)  IV 2 g (02/25/19 1141)   dextrose 5 % 1,000 mL with sodium bicarbonate 150 mEq infusion 100 mL/hr at 02/25/19 1615   levETIRAcetam 500 mg (02/25/19 1105)   sodium chloride     And   sodium chloride     And   sodium chloride     vancomycin       LOS: 2 days    Critical care procedure note Authorized and performed by: Erick Blinks Total critical care time: Approximately 40 minutes Due to high probability of clinically significant, life-threatening deterioration, the patient required my highest level of  preparedness to intervene emergently and I personally spent this critical care time directly and personally managing the patient.  The critical care time included obtaining a history, examining the patient, pulse oximetry, ordering and review of studies, arranging urgent treatment with development of a management plan, evaluation of patient's response to treatment, frequent reassessment, discussions with other providers.  Critical care time was performed to assess and manage the high probability of imminent, life-threatening deterioration that could result in multiorgan failure.  It was exclusive of separate billable procedures and treating other patients and teaching time.  Please see MDM section and the rest of the of note for further information on patient assessment and treatment    Erick Blinks, MD [facility]   If 7PM-7AM, please contact night-coverage [email or web address]  02/25/2019, 5:13 PM

## 2019-02-25 NOTE — Progress Notes (Signed)
  Speech Language Pathology Treatment: Dysphagia  Patient Details Name: Luke Garcia MRN: [id number] DOB: July 27, 1950 Today's Date: 02/25/2019 Time: 6384-5364 SLP Time Calculation (min) (ACUTE ONLY): 31 min  Assessment / Plan / Recommendation Clinical Impression  Pt seen for ongoing diagnostic dysphagia intervention following MBSS completed yesterday. Pt appears with improved alertness today, however he continues to fluctuate making him a poor candidate for oral diet at this time. Pt presents with generalized oral weakness (appears worse on Left), reduced vocal intensity, reduced breath support with weak volitional cough, and dysarthria. Imaging from MBSS completed yesterday was reviewed by this SLP- Pt with weak pharyngeal swallow and poor airway protection resulting in moderate pharyngeal residuals and silent aspiration at times. Pt appears to have several medical comorbidities in setting of acute CVA. Recommend temporary alternative means of nutrition (consider small bore feeding tube) until alertness and strength improves, good oral care (encourage Pt to assist with this), and ice chips/small sips of water after oral care to facilitate increased swallow attempts and reduce risk of muscle disuse atrophy. SLP will continue to follow during acute stay. Pt will need OT consult if not already ordered.   HPI HPI: Luke Garcia  is a 68 y.o. male with past medical history relevant for prior history of alcohol abuse, patient denies current use, hypertension and DM who  lives in camper behind friends house-presents to the ED with shortness of breath and noted to have multiple bruises on body. MRI reveals "There is a punctate focus of restricted diffusion in the right cerebellar hemisphere reflecting acute infarct." BSE ordered to evaluate swallowing function.      SLP Plan  Continue with current plan of care       Recommendations  Diet recommendations: NPO(ice chips and small sips of water after oral  care) Medication Administration: Via alternative means                General recommendations: OT consult Oral Care Recommendations: Oral care prior to ice chip/H20;Staff/trained caregiver to provide oral care Follow up Recommendations: Skilled Nursing facility SLP Visit Diagnosis: Dysphagia, oropharyngeal phase (R13.12) Plan: Continue with current plan of care       Thank you,  Genene Churn, [place]                 [place]rent [place] 02/25/2019, 9:27 AM

## 2019-02-25 NOTE — Progress Notes (Signed)
[facility] Dennys Guin A. Gerilyn Pilgrim, MD     [email or web address]          Luke Garcia is an 68 y.o. male.   Assessment/Plan: 1. Acute encephalopathy with unresponsiveness and dysarthria. The etiology is most likely multifactorial. 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  Follow-up EEG.  ----EEG is normal and therefore is reasonable to discontinue the Keppra.  Consider restarting the Keppra and repeat EEG if he has worsening confusion. 2.  New onset Atrial fibrillation: Long-term the patient should be anticoagulated but given the thrombocytopenia, this should be held for now. Consider anticoagulation once platelets are above 100,000. 3.  Remote large vessel right parietal occipital infarct 4. Thrombocytopenia 5. 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   The patient is much more alert and less confused although he has significant dysarthria that is still present.    GENERAL:   He is responsive and cooperative today.  HEENT:   Neck is supple no trauma appreciated.  EXTREMITIES: No edema   BACK: Normal alignment.  SKIN: Normal by inspection.    MENTAL STATUS:   He is more responsive today than previously.  He does follow midline commands and also appendicular commands which he did not previously.  He is now oriented only to person but also to being in the hospital.  He still has significant dysarthria however.  CRANIAL NERVES: Pupils are equal, round and reactive to light; extraocular movements are full, there is no significant nystagmus; upper and lower facial muscles are normal in strength and symmetric, there is no flattening of the nasolabial folds; tongue is midline  MOTOR:  He has antigravity strength in all 4  extremities. Bulk and tone are normal. Exact strength is not known given the lack of cooperation however.  COORDINATION:  No evidence of dysmetria, myoclonus or tremors. No parkinsonism.  REFLEXES: Deep tendon reflexes are symmetrical and normal.   SENSATION:  He responds normally to painful stimuli.      Objective: Vital signs in last 24 hours: Temp:  [97.8 F (36.6 C)-98.5 F (36.9 C)] 97.9 F (36.6 C) (11/12 1230) Pulse Rate:  [61-105] 73 (11/12 1332) Resp:  [16-24] 18 (11/12 1332) BP: (71-97)/(48-62) 71/50 (11/12 1332) SpO2:  [97 %-100 %] 97 % (11/12 1332) Weight:  [95.7 kg] 95.7 kg (11/12 0500)  Intake/Output from previous day: 11/11 0701 - 11/12 0700 In: 346.2 [I.V.:46.2; IV Piggyback:300] Out: 800 [Urine:800] Intake/Output this shift: Total I/O In: 120 [P.O.:120] Out: -  Nutritional status:  Diet Order            DIET DYS 2 Room service appropriate? Yes; Fluid consistency: Honey Thick  Diet effective now               Lab Results: Results for orders placed or performed during the hospital encounter of 03/06/2019 (from the past 48 hour(s))  Glucose, capillary     Status: Abnormal   Collection Time: 02/23/19  9:17 PM  Result Value Ref Range   Glucose-Capillary 185 (H) 70 - 99 mg/dL  Ammonia     Status: None   Collection Time: 02/24/19  4:07 AM  Result Value Ref Range   Ammonia 25 9 - 35 umol/L    Comment: Performed at [facility], [address], [place],  [place] [address]  Comprehensive metabolic panel/CMP     Status: Abnormal   Collection Time: 02/24/19  4:07 AM  Result Value Ref Range   Sodium 129 (L) 135 - 145 mmol/L   Potassium 3.8 3.5 - 5.1 mmol/L   Chloride 99 98 - 111 mmol/L   CO2 18 (L) 22 - 32 mmol/L   Glucose, Bld 181 (H) 70 - 99 mg/dL   BUN 59 (H) 8 - 23 mg/dL   Creatinine, Ser 4.092.39 (H) 0.61 - 1.24 mg/dL   Calcium 7.4 (L) 8.9 - 10.3 mg/dL   Total Protein 6.8 6.5 - 8.1 g/dL   Albumin 1.8 (L) 3.5 - 5.0 g/dL   AST 31 15 - 41 U/L    ALT 17 0 - 44 U/L   Alkaline Phosphatase 123 38 - 126 U/L   Total Bilirubin 4.0 (H) 0.3 - 1.2 mg/dL   GFR calc non Af Amer 27 (L) >60 mL/min   GFR calc Af Amer 31 (L) >60 mL/min   Anion gap 12 5 - 15    Comment: Performed at [facility], [address] [place], [place] [address]  CBC     Status: Abnormal   Collection Time: 02/24/19  4:07 AM  Result Value Ref Range   WBC 23.8 (H) 4.0 - 10.5 K/uL   RBC 2.44 (L) 4.22 - 5.81 MIL/uL   Hemoglobin 8.9 (L) 13.0 - 17.0 g/dL   HCT 47.828.6 (L) 29.539.0 - 62.152.0 %   MCV 117.2 (H) 80.0 - 100.0 fL   MCH 36.5 (H) 26.0 - 34.0 pg   MCHC 31.1 30.0 - 36.0 g/dL   RDW 30.815.0 65.711.5 - 84.615.5 %   Platelets 21 (LL) 150 - 400 K/uL    Comment: Immature Platelet Fraction may be clinically indicated, consider ordering this additional test [id number] THIS CRITICAL RESULT HAS VERIFIED AND BEEN CALLED TO A AMBURN,RN BY MARIE KELLY ON 11 11 2020 AT 0511, AND HAS BEEN READ BACK.     nRBC 0.0 0.0 - 0.2 %    Comment: Performed at [facility], [address] [place], [place] [address]  Glucose, capillary     Status: Abnormal   Collection Time: 02/24/19  7:58 AM  Result Value Ref Range   Glucose-Capillary 172 (H) 70 - 99 mg/dL  Culture, blood (Routine X 2) w Reflex to ID Panel     Status: None (Preliminary result)   Collection Time: 02/24/19  1:17 PM   Specimen: BLOOD RIGHT ARM  Result Value Ref Range   Specimen Description BLOOD RIGHT ARM    Special Requests      BOTTLES DRAWN AEROBIC AND ANAEROBIC Blood Culture adequate volume   Culture      NO GROWTH < 24 HOURS Performed at [facility], [address] [place], [place] [address]    Report Status PENDING   Culture, blood (Routine X 2) w Reflex to ID Panel     Status: None (Preliminary result)   Collection Time: 02/24/19  1:25 PM   Specimen: BLOOD RIGHT HAND  Result Value Ref Range   Specimen Description BLOOD RIGHT HAND    Special Requests      BOTTLES DRAWN AEROBIC ONLY Blood Culture results may not be optimal due  to an inadequate volume of blood received in culture bottles   Culture      NO GROWTH < 24 HOURS Performed at [facility], [address] [place], [place] [address]    Report Status PENDING   Glucose, capillary  Status: Abnormal   Collection Time: 02/24/19  4:02 PM  Result Value Ref Range   Glucose-Capillary 206 (H) 70 - 99 mg/dL  Glucose, capillary     Status: Abnormal   Collection Time: 02/24/19  9:41 PM  Result Value Ref Range   Glucose-Capillary 232 (H) 70 - 99 mg/dL  CBC with Differential/Platelet     Status: Abnormal   Collection Time: 02/25/19  5:30 AM  Result Value Ref Range   WBC 29.5 (H) 4.0 - 10.5 K/uL   RBC 2.37 (L) 4.22 - 5.81 MIL/uL   Hemoglobin 8.7 (L) 13.0 - 17.0 g/dL   HCT 27.4 (L) 39.0 - 52.0 %   MCV 115.6 (H) 80.0 - 100.0 fL   MCH 36.7 (H) 26.0 - 34.0 pg   MCHC 31.8 30.0 - 36.0 g/dL   RDW 15.7 (H) 11.5 - 15.5 %   Platelets 9 (LL) 150 - 400 K/uL    Comment: PLATELET COUNT CONFIRMED BY SMEAR SPECIMEN CHECKED FOR CLOTS Immature Platelet Fraction may be clinically indicated, consider ordering this additional test [id number] CRITICAL VALUE NOTED.  VALUE IS CONSISTENT WITH PREVIOUSLY REPORTED AND CALLED VALUE.    nRBC 0.0 0.0 - 0.2 %   Neutrophils Relative % 83 %   Neutro Abs 24.3 (H) 1.7 - 7.7 K/uL   Lymphocytes Relative 10 %   Lymphs Abs 3.0 0.7 - 4.0 K/uL   Monocytes Relative 5 %   Monocytes Absolute 1.5 (H) 0.1 - 1.0 K/uL   Eosinophils Relative 0 %   Eosinophils Absolute 0.0 0.0 - 0.5 K/uL   Basophils Relative 0 %   Basophils Absolute 0.1 0.0 - 0.1 K/uL   Immature Granulocytes 2 %   Abs Immature Granulocytes 0.58 (H) 0.00 - 0.07 K/uL    Comment: Performed at [facility], [address], [place], [place] [address]  Basic metabolic panel     Status: Abnormal   Collection Time: 02/25/19  5:30 AM  Result Value Ref Range   Sodium 130 (L) 135 - 145 mmol/L   Potassium 4.7 3.5 - 5.1 mmol/L    Comment: DELTA CHECK NOTED   Chloride 100 98 - 111 mmol/L    CO2 16 (L) 22 - 32 mmol/L   Glucose, Bld 233 (H) 70 - 99 mg/dL   BUN 74 (H) 8 - 23 mg/dL   Creatinine, Ser 3.15 (H) 0.61 - 1.24 mg/dL   Calcium 7.4 (L) 8.9 - 10.3 mg/dL   GFR calc non Af Amer 19 (L) >60 mL/min   GFR calc Af Amer 22 (L) >60 mL/min   Anion gap 14 5 - 15    Comment: Performed at [facility], [address] [place], [place] [address]  Prepare Pheresed Platelets     Status: None (Preliminary result)   Collection Time: 02/25/19  7:40 AM  Result Value Ref Range   Unit Number [id number]    Blood Component Type PLTP LR1 PAS    Unit division 00    Status of Unit ISSUED    Transfusion Status      OK TO TRANSFUSE Performed at [facility], [address], [place], [place] [address]   ABO/Rh     Status: None   Collection Time: 02/25/19  7:40 AM  Result Value Ref Range   ABO/RH(D)      A POS Performed at [facility], [address] [place], [place] [address]   Glucose, capillary     Status: Abnormal   Collection Time: 02/25/19  8:08 AM  Result Value Ref Range   Glucose-Capillary 231 (H) 70 - 99 mg/dL  Protime-INR     Status: Abnormal   Collection Time: 02/25/19  9:48 AM  Result Value Ref Range   Prothrombin Time 22.0 (H) 11.4 - 15.2 seconds   INR 2.0 (H) 0.8 - 1.2    Comment: (NOTE) INR goal varies based on device and disease states. Performed at [facility], [address] [place], [place] [address]   Glucose, capillary     Status: Abnormal   Collection Time: 02/25/19 11:06 AM  Result Value Ref Range   Glucose-Capillary 217 (H) 70 - 99 mg/dL  Lactate dehydrogenase     Status: Abnormal   Collection Time: 02/25/19 11:19 AM  Result Value Ref Range   LDH 279 (H) 98 - 192 U/L    Comment: Performed at [facility], [address], [place], [place] [address]  Save Smear     Status: None   Collection Time: 02/25/19 11:19 AM  Result Value Ref Range   Smear Review SMEAR STAINED AND AVAILABLE FOR REVIEW     Comment: Performed at [facility], [address]  [place], [place] [address]  DIC (disseminated intravasc coag) panel     Status: Abnormal   Collection Time: 02/25/19 11:19 AM  Result Value Ref Range   Prothrombin Time 21.8 (H) 11.4 - 15.2 seconds   INR 1.9 (H) 0.8 - 1.2    Comment: (NOTE) INR goal varies based on device and disease states.    aPTT 49 (H) 24 - 36 seconds    Comment:        IF BASELINE aPTT IS ELEVATED, SUGGEST PATIENT RISK ASSESSMENT BE USED TO DETERMINE APPROPRIATE ANTICOAGULANT THERAPY.    Fibrinogen 143 (L) 210 - 475 mg/dL   D-Dimer, Quant 46.96 (H) 0.00 - 0.50 ug/mL-FEU    Comment: (NOTE) At the manufacturer cut-off of 0.50 ug/mL FEU, this assay has been documented to exclude PE with a sensitivity and negative predictive value of 97 to 99%.  At this time, this assay has not been approved by the FDA to exclude DVT/VTE. Results should be correlated with clinical presentation.    Platelets 14 (LL) 150 - 400 K/uL    Comment: PLATELET COUNT CONFIRMED BY SMEAR SPECIMEN CHECKED FOR CLOTS Immature Platelet Fraction may be clinically indicated, consider ordering this additional test [id number] THIS CRITICAL RESULT HAS VERIFIED AND BEEN CALLED TO V BASS BY HILLARY FLYNT ON 11 12 2020 AT 1142, AND HAS BEEN READ BACK.     Smear Review NO SCHISTOCYTES SEEN     Comment: Performed at [facility], [address], [place], [place] [address]  Glucose, capillary     Status: Abnormal   Collection Time: 02/25/19  4:09 PM  Result Value Ref Range   Glucose-Capillary 195 (H) 70 - 99 mg/dL    Lipid Panel No results for input(s): CHOL, TRIG, HDL, CHOLHDL, VLDL, LDLCALC in the last 72 hours.  Studies/Results:   Medications:  Scheduled Meds: . atorvastatin  80 mg Oral q1800  . Chlorhexidine Gluconate Cloth  6 each Topical Daily  . folic acid  1 mg Oral Daily  . insulin aspart  0-5 Units Subcutaneous QHS  . insulin aspart  0-9 Units Subcutaneous TID WC  . metoprolol tartrate  25 mg Oral BID  . multivitamin with minerals  1  tablet Oral Daily  . thiamine  100 mg Oral Daily   Continuous Infusions: . cefTRIAXone (ROCEPHIN)  IV 2 g (02/25/19 1141)  .  dextrose 5 % 1,000 mL with sodium bicarbonate 150 mEq infusion 100 mL/hr at 02/25/19 1615  . levETIRAcetam 500 mg (02/25/19 1105)  . vancomycin     PRN Meds:.acetaminophen **OR** acetaminophen, albuterol, LORazepam, ondansetron **OR** ondansetron (ZOFRAN) IV, polyethylene glycol, traZODone     LOS: 2 days   Temisha Murley A. Gerilyn Pilgrim, M.D.  Diplomate, Biomedical engineer of Psychiatry and Neurology ( Neurology).

## 2019-02-25 NOTE — Progress Notes (Addendum)
CRITICAL VALUE ALERT  Critical Value:  Platelets 9   Date & Time Notied:  02/25/2019 0149  Provider Notified: Dr. Darrick Meigs  Orders Received/Actions taken: awaiting instructions

## 2019-02-25 NOTE — Progress Notes (Signed)
Patient transferred to ICU Bed 9 via bed accompanied by RN and nurse techs. Report given to Leonette Most, RN prior to transfer. Donavan Foil, RN

## 2019-02-25 NOTE — Procedures (Signed)
  Bridgman A. Merlene Laughter, MD     [email or web address]           HISTORY: This is a 68 year old who presents with episodes of unresponsiveness suspicious for seizures.  MEDICATIONS:  Current Facility-Administered Medications:  .  acetaminophen (TYLENOL) tablet 650 mg, 650 mg, Oral, Q6H PRN **OR** acetaminophen (TYLENOL) suppository 650 mg, 650 mg, Rectal, Q6H PRN, Emokpae, Courage, MD .  albuterol (PROVENTIL) (2.5 MG/3ML) 0.083% nebulizer solution 2.5 mg, 2.5 mg, Nebulization, Q2H PRN, Emokpae, Courage, MD .  atorvastatin (LIPITOR) tablet 80 mg, 80 mg, Oral, q1800, Emokpae, Courage, MD .  cefTRIAXone (ROCEPHIN) 2 g in sodium chloride 0.9 % 100 mL IVPB, 2 g, Intravenous, Q24H, Emokpae, Courage, MD, Last Rate: 200 mL/hr at 02/25/19 1141, 2 g at 02/25/19 1141 .  Chlorhexidine Gluconate Cloth 2 % PADS 6 each, 6 each, Topical, Daily, Denton Brick, Courage, MD, 6 each at 02/25/19 0954 .  dextrose 5 % 1,000 mL with sodium bicarbonate 150 mEq infusion, , Intravenous, Continuous, Memon, Jehanzeb, MD, Last Rate: 100 mL/hr at 02/25/19 1615 .  folic acid (FOLVITE) tablet 1 mg, 1 mg, Oral, Daily, Emokpae, Courage, MD, 1 mg at 02/24/19 0900 .  insulin aspart (novoLOG) injection 0-5 Units, 0-5 Units, Subcutaneous, QHS, Emokpae, Courage, MD, 2 Units at 02/24/19 2253 .  insulin aspart (novoLOG) injection 0-9 Units, 0-9 Units, Subcutaneous, TID WC, Emokpae, Courage, MD, 3 Units at 02/25/19 1141 .  levETIRAcetam (KEPPRA) IVPB 500 mg/100 mL premix, 500 mg, Intravenous, Q12H, Cory Kitt, MD, Last Rate: 400 mL/hr at 02/25/19 1105, 500 mg at 02/25/19 1105 .  LORazepam (ATIVAN) injection 1 mg, 1 mg, Intravenous, Q6H PRN, Denton Brick, Courage, MD, 1 mg at 02/24/19 0250 .  metoprolol tartrate (LOPRESSOR) tablet 25 mg, 25 mg, Oral, BID, Arnoldo Lenis, MD, 25 mg at 02/24/19 2249 .  multivitamin with minerals tablet 1 tablet, 1 tablet, Oral, Daily, Emokpae, Courage, MD, 1 tablet at 02/24/19 0900 .   ondansetron (ZOFRAN) tablet 4 mg, 4 mg, Oral, Q6H PRN **OR** ondansetron (ZOFRAN) injection 4 mg, 4 mg, Intravenous, Q6H PRN, Emokpae, Courage, MD .  polyethylene glycol (MIRALAX / GLYCOLAX) packet 17 g, 17 g, Oral, Daily PRN, Emokpae, Courage, MD .  thiamine (VITAMIN B-1) tablet 100 mg, 100 mg, Oral, Daily, Emokpae, Courage, MD, 100 mg at 02/24/19 0900 .  traZODone (DESYREL) tablet 50 mg, 50 mg, Oral, QHS PRN, Emokpae, Courage, MD .  vancomycin (VANCOCIN) 1,250 mg in sodium chloride 0.9 % 250 mL IVPB, 1,250 mg, Intravenous, Q24H, Emokpae, Courage, MD     ANALYSIS: A 16 channel recording using standard 10 20 measurements is conducted for 23 minutes.  The background activity gets as high as 8-8.5 Hz.  There is a beta activity observed in the frontal areas.  Drowsy activity is observed and is followed by sleep spindles and K complexes indicating stage II non-REM sleep.  Photic stimulation and hyperventilation are not carried out.  There is no focal or lateral slowing.  There is no epileptiform activity is noted.   IMPRESSION: 1.  This is a normal recording of awake and sleep states.      Keighan Amezcua A. Merlene Laughter, M.D.  Diplomate, Tax adviser of Psychiatry and Neurology ( Neurology).

## 2019-02-25 NOTE — Progress Notes (Signed)
Physical Therapy Note  Patient Details  Name: Luke Garcia MRN: [id number] Date of Birth: [birth year] Today's Date: 02/25/2019    Attempted therex in bed as patient with low platelet count at 9 today.  Pt able to complete a few reps of LE therex and then would drift back off to sleep.  Unable to keep patient actively engaged during session.  Withheld today.  Teena Irani, PTA/CLT [phone]    Roseanne Reno B 02/25/2019, 3:55 PM

## 2019-02-25 NOTE — Progress Notes (Signed)
Report given to Hinton Dyer, RN at [facility]

## 2019-02-25 NOTE — Progress Notes (Signed)
EEG Completed; Results Pending  

## 2019-02-25 NOTE — H&P (Addendum)
NAME:  Luke Garcia, MRN:  [id number], DOB:  09-14-1950, LOS: 2 ADMISSION DATE:  03-02-19, CONSULTATION DATE:  02/25/19 REFERRING MD:  [facility]   CHIEF COMPLAINT:  weakness   Brief History   Luke Garcia is a 68 y.o. male who was admitted to [facility] 11/9 with AFRVR, hyperglycemia, AKI.  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 Transferred to [facility] for CT Surgery Evaluation   History of present illness    Luke Garcia is a 68 y.o. male who has a PMH including but not limited to HTN, DM, A.fib, EtOH abuse.  He presented to [facility] ED 11/9 with dyspnea and generalized weakness.  He apparently lives in a camper behind a friends house.  In ED he was found to have AFRVR with HR in 150s, CBG > 500, AKI.  He was admitted by [facility] and started on cardizem gtt.  Anticoagulation was held due to significant thrombocytopenia.  After admission, echo revealed LVEF 60-65%, severe LVH, normal RV, severe biatrial enlargement, large tricuspid valve vegetation.  Infectious workup revealed strep epidermidis (oxa S) in 4/4 bottles.  He was started on vanc and ceftriaxone.  ID was consulted and abx were switched to single therapy cefazolin.  CT head was negative but brain MRI revealed acute right cerebellar infarct.    On 11/12, he became hypotensive and was subsequently started on levophed.  He was later transferred to [facility] for further evaluation and management including TCTS consult given TV vegetation.  Past Medical History  11/9 > admit. 11/12 > transfer to [facility].  Significant Hospital Events   HTN, DM, A.fib, EtOH abuse.  Consults:  Cardiology, Neurology, ID, PCCM.  Procedures:  None.  Significant Diagnostic Tests:  CT Head 11/9 > no acute process. MRI brain 11/10 > small right cerebellar infarct. Abd [place] 11/10 > cirrhosis, small volume ascites. Echo 11/10 > EF 60-65%, severely dilated LA and RA, TV vegetation. EEG 11/12 > normal.  Micro Data:  Blood 11/9 > Staph epidermidis. SARS CoV2 11/9 > neg.   Antimicrobials:  Ceftriaxone 11/9 > 11/12. Vanc 11/9 > 11/10. Cefazolin 11/12 >   Interim history/subjective:  As above   Objective:  Blood pressure (!) 80/60, pulse 76, temperature (!) 95.2 F (35.1 C), temperature source Rectal, resp. rate 16, height 6\' 1"  (1.854 m), weight 95.6 kg, SpO2 100 %.        Intake/Output Summary (Last 24 hours) at 02/25/2019 1829 Last data filed at 02/25/2019 1300 Gross per 24 hour  Intake 334.47 ml  Output 800 ml  Net -465.53 ml   Filed Weights   02/24/19 0500 02/25/19 0500 02/25/19 1805  Weight: 91.9 kg 95.7 kg 95.6 kg    Examination: General: Adult male, no distress  Neuro: alert, oriented, slurred speech  HEENT: dry MM  Cardiovascular: irregular, no MRG Lungs: diminished breath sounds, no crackles/wheeze Abdomen: non-tender, active bowel sounds  Musculoskeletal: +2 Bilateral upper extremity edema > bruising noted on upper and lower as well as groin    Skin: intact   Assessment & Plan:   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 Plan - Continue cefazolin per ID. - Needs TEE at some point. - Day team to please consult TCTS. - Continue NEO as needed to maintain goal MAP > 65. - Would ideally avoid CVL given bacteremia. - Obtain CT Chest  - Assess cortisol and consider stress steroids.  A.fib with RVR. Plan - No AC due to severe thrombocytopenia. - currently rate control, BB  D/C due to hypotension.  Acute Kidney Injury, Lactic Acidosis  Plan  - Continue Bicarb gtt  - Follow BMP. - May need Nephrology consult given progressive AKI  Encephalopathy in setting of sepsis and uremia  Right cerebellar CVA. Plan - Day team to please consult neurology for ongoing management. - Continue keppra.  Dysphagia. Plan - Needs continued SLP eval / monitoring. >> speech evaluation 11/12 recommend NPO status  - Order placed for Cortrack   Cirrhosis - presumed 2/2 EtOH. EtOH abuse. Plan - Thiamine / Folate. - GI follow up as  outpatient.  Severe thrombocytopenia - multifactorial in setting of sepsis and cirrhosis.   -Peripheral smear neg for schistocytes ? HIV - screen pos but Ab neg. Plan. - Assess coombs, hemolysis labs - RNA pending  DM. Plan -Trend glucose  - SSI.   Best Practice:  Diet: NPO DVT prophylaxis: SCD's GI prophylaxis: Pepcid  Glucose control: SSI Mobility: Bedrest Code Status: FC Family Communication: Updated on plan of care  Disposition: Continue ICU Care   Labs   CBC: Recent Labs  Lab 03/07/19 1135 02/23/19 0425 02/24/19 0407 02/25/19 0530 02/25/19 1119  WBC 26.6* 28.4* 23.8* 29.5*  --   NEUTROABS  --   --   --  24.3*  --   HGB 8.6* 7.6* 8.9* 8.7*  --   HCT 26.6* 23.6* 28.6* 27.4*  --   MCV 113.7* 113.5* 117.2* 115.6*  --   PLT 37* 34* 21* 9* 14*   Basic Metabolic Panel: Recent Labs  Lab 03/07/19 1135 03/07/19 1729 02/23/19 0425 02/24/19 0407 02/25/19 0530  NA 129*  --  130* 129* 130*  K 4.1  --  3.7 3.8 4.7  CL 96*  --  100 99 100  CO2 20*  --  18* 18* 16*  GLUCOSE 421*  --  231* 181* 233*  BUN 49*  --  52* 59* 74*  CREATININE 2.13*  --  2.19* 2.39* 3.15*  CALCIUM 7.5*  --  7.3* 7.4* 7.4*  MG  --  1.2*  --   --   --    GFR: Estimated Creatinine Clearance: 25.7 mL/min (A) (by C-G formula based on SCr of 3.15 mg/dL (H)). Recent Labs  Lab 03/07/19 1135 02/23/19 0425 02/24/19 0407 02/25/19 0530  WBC 26.6* 28.4* 23.8* 29.5*   Liver Function Tests: Recent Labs  Lab 03/07/19 1148 02/24/19 0407  AST 35 31  ALT 19 17  ALKPHOS 137* 123  BILITOT 4.6* 4.0*  PROT 7.3 6.8  ALBUMIN 1.9* 1.8*   No results for input(s): LIPASE, AMYLASE in the last 168 hours. Recent Labs  Lab 02/24/19 0407  AMMONIA 25   ABG No results found for: PHART, PCO2ART, PO2ART, HCO3, TCO2, ACIDBASEDEF, O2SAT  Coagulation Profile: Recent Labs  Lab 02/23/19 1142 02/25/19 0948 02/25/19 1119  INR 1.8* 2.0* 1.9*   Cardiac Enzymes: No results for input(s): CKTOTAL, CKMB,  CKMBINDEX, TROPONINI in the last 168 hours. HbA1C: Hgb A1c MFr Bld  Date/Time Value Ref Range Status  03/07/2019 05:27 PM 6.8 (H) 4.8 - 5.6 % Final    Comment:    (NOTE) Pre diabetes:          5.7%-6.4% Diabetes:              >6.4% Glycemic control for   <7.0% adults with diabetes   03/11/2012 10:03 AM 9.3 (H) <5.7 % Final    Comment:    (NOTE)  According to the ADA Clinical Practice Recommendations for 2011, when HbA1c is used as a screening test:  >=6.5%   Diagnostic of Diabetes Mellitus           (if abnormal result is confirmed) 5.7-6.4%   Increased risk of developing Diabetes Mellitus References:Diagnosis and Classification of Diabetes Mellitus,Diabetes Care,2011,34(Suppl 1):S62-S69 and Standards of Medical Care in         Diabetes - 2011,Diabetes Care,2011,34 (Suppl 1):S11-S61.   CBG: Recent Labs  Lab 02/24/19 1602 02/24/19 2141 02/25/19 0808 02/25/19 1106 02/25/19 1609  GLUCAP 206* 232* 231* 217* 195*    Review of Systems:     Past medical history  He,  has a past medical history of Atrial fibrillation (HCC), Diabetes mellitus without complication (HCC), and Hypertension.   Surgical History   History reviewed. No pertinent surgical history.   Social History   reports that he has never smoked. He has never used smokeless tobacco. He reports current alcohol use. He reports that he does not use drugs.   Family history   His family history includes Cancer - Other in his mother; Congestive Heart Failure in his father; Pancreatic cancer in his father.   Allergies Allergies  Allergen Reactions  . Aspirin Anaphylaxis  . Nsaids Anaphylaxis  . Rhubarb   . Strawberry Extract   . Tylenol [Acetaminophen]      Home meds  Prior to Admission medications   Medication Sig Start Date End Date Taking? Authorizing Provider  metFORMIN (GLUCOPHAGE) 500 MG tablet Take 1 tablet (500 mg total) by mouth 2  (two) times daily with a meal. Patient taking differently: Take 500 mg by mouth daily as needed.  03/12/12  Yes Cristal Ford, MD  carvedilol (COREG) 12.5 MG tablet Take 1 tablet (12.5 mg total) by mouth 2 (two) times daily with a meal. Patient not taking: Reported on 03/08/2019 03/12/12   Cristal Ford, MD  folic acid (FOLVITE) 1 MG tablet Take 1 tablet (1 mg total) by mouth daily. Patient not taking: Reported on 03/06/2019 03/12/12   Cristal Ford, MD  glipiZIDE (GLUCOTROL) 2.5 mg TABS Take 0.5 tablets (2.5 mg total) by mouth 2 (two) times daily before a meal. Patient not taking: Reported on 02/21/2019 03/12/12   Cristal Ford, MD  lisinopril (PRINIVIL,ZESTRIL) 5 MG tablet Take 1 tablet (5 mg total) by mouth daily. Patient not taking: Reported on 03/12/2019 03/12/12   Cristal Ford, MD  lovastatin (MEVACOR) 20 MG tablet Take 1 tablet (20 mg total) by mouth at bedtime. Patient not taking: Reported on 03/07/2019 03/12/12   Cristal Ford, MD  Multiple Vitamin (MULTIVITAMIN WITH MINERALS) TABS Take 1 tablet by mouth daily. Patient not taking: Reported on 02/19/2019 03/12/12   Cristal Ford, MD  thiamine 100 MG tablet Take 1 tablet (100 mg total) by mouth daily. Patient not taking: Reported on 02/15/2019 03/12/12   Cristal Ford, MD    Critical care time: 52 minutes   Patient seen, case reviewed and patient examined at bedside with APP [place].  Critical care plan discussed at length.  Clinically patient is stable but prognosis is poor.  Agree with above assessment and plan. Dr Emelda Brothers MD   Jovita Kussmaul, AGACNP-BC [facility]  PCCM Pgr: [phone]

## 2019-02-25 NOTE — Progress Notes (Signed)
BP running low today in 90s/50s per report from off going RN. BP noted to be 70/52 this afternoon on review of vitals. BP 80/58 manually. Notified Dr. Roderic Palau. Patient alert and responding to voice. Patient seen by MD. Orders placed to transfer to ICU bed and initiate Code Sepsis. Bed requested placed, notified ICU staff of need for bed. Notified [facility] of Code Sepsis per ordered protocol. Notified charge Oceanographer. Fluid bolus 1 of 3 initiated. Donavan Foil, RN

## 2019-02-25 NOTE — Plan of Care (Signed)
  Problem: Education: Goal: Knowledge of General Education information will improve Description Including pain rating scale, medication(s)/side effects and non-pharmacologic comfort measures Outcome: Progressing   Problem: Health Behavior/Discharge Planning: Goal: Ability to manage health-related needs will improve Outcome: Progressing   

## 2019-02-25 NOTE — Progress Notes (Signed)
[facility] arrived on unit for transport of patient to [facility] 3M05. Patient told about transport and reason for transfer. Pt agreeable. [facility] team given a bag of levophed due to pt bp dropping slightly as they arrived. Current BP is 87/56 (67). Paperwork including transfer forms given to [facility] team.

## 2019-02-25 NOTE — Progress Notes (Signed)
[facility] Progress Note Patient Name: Luke Garcia DOB: 1951-03-17 MRN: [id number]   Date of Service  02/25/2019  HPI/Events of Note  Pt transferred from [facility] due to staph epi tricuspid valve endocarditis, AF-RVR, sepsis, and CVA.  eICU Interventions  New patient evaluation completed.        Luke Garcia 02/25/2019, 10:00 PM

## 2019-02-26 DIAGNOSIS — I10 Essential (primary) hypertension: Secondary | ICD-10-CM

## 2019-02-26 DIAGNOSIS — E118 Type 2 diabetes mellitus with unspecified complications: Secondary | ICD-10-CM

## 2019-02-26 DIAGNOSIS — G9340 Encephalopathy, unspecified: Secondary | ICD-10-CM

## 2019-02-26 DIAGNOSIS — Z7189 Other specified counseling: Secondary | ICD-10-CM

## 2019-02-26 DIAGNOSIS — I330 Acute and subacute infective endocarditis: Secondary | ICD-10-CM

## 2019-02-26 DIAGNOSIS — Z91018 Allergy to other foods: Secondary | ICD-10-CM

## 2019-02-26 DIAGNOSIS — R6520 Severe sepsis without septic shock: Secondary | ICD-10-CM

## 2019-02-26 DIAGNOSIS — Z888 Allergy status to other drugs, medicaments and biological substances status: Secondary | ICD-10-CM

## 2019-02-26 DIAGNOSIS — R7881 Bacteremia: Secondary | ICD-10-CM

## 2019-02-26 DIAGNOSIS — Z886 Allergy status to analgesic agent status: Secondary | ICD-10-CM

## 2019-02-26 DIAGNOSIS — J90 Pleural effusion, not elsewhere classified: Secondary | ICD-10-CM

## 2019-02-26 DIAGNOSIS — R188 Other ascites: Secondary | ICD-10-CM

## 2019-02-26 DIAGNOSIS — B957 Other staphylococcus as the cause of diseases classified elsewhere: Secondary | ICD-10-CM

## 2019-02-26 DIAGNOSIS — R911 Solitary pulmonary nodule: Secondary | ICD-10-CM

## 2019-02-26 DIAGNOSIS — A419 Sepsis, unspecified organism: Secondary | ICD-10-CM

## 2019-02-26 DIAGNOSIS — D696 Thrombocytopenia, unspecified: Secondary | ICD-10-CM

## 2019-02-26 DIAGNOSIS — I4891 Unspecified atrial fibrillation: Secondary | ICD-10-CM

## 2019-02-26 DIAGNOSIS — Z515 Encounter for palliative care: Secondary | ICD-10-CM

## 2019-02-26 LAB — CBC WITH DIFFERENTIAL/PLATELET
Abs Immature Granulocytes: 0.53 10*3/uL — ABNORMAL HIGH (ref 0.00–0.07)
Basophils Absolute: 0 10*3/uL (ref 0.0–0.1)
Basophils Relative: 0 %
Eosinophils Absolute: 0 10*3/uL (ref 0.0–0.5)
Eosinophils Relative: 0 %
HCT: 24.5 % — ABNORMAL LOW (ref 39.0–52.0)
Hemoglobin: 8.1 g/dL — ABNORMAL LOW (ref 13.0–17.0)
Immature Granulocytes: 2 %
Lymphocytes Relative: 10 %
Lymphs Abs: 3.1 10*3/uL (ref 0.7–4.0)
MCH: 37.3 pg — ABNORMAL HIGH (ref 26.0–34.0)
MCHC: 33.1 g/dL (ref 30.0–36.0)
MCV: 112.9 fL — ABNORMAL HIGH (ref 80.0–100.0)
Monocytes Absolute: 1.8 10*3/uL — ABNORMAL HIGH (ref 0.1–1.0)
Monocytes Relative: 6 %
Neutro Abs: 25.6 10*3/uL — ABNORMAL HIGH (ref 1.7–7.7)
Neutrophils Relative %: 82 %
Platelets: 26 10*3/uL — CL (ref 150–400)
RBC: 2.17 MIL/uL — ABNORMAL LOW (ref 4.22–5.81)
RDW: 15.9 % — ABNORMAL HIGH (ref 11.5–15.5)
WBC: 31.2 10*3/uL — ABNORMAL HIGH (ref 4.0–10.5)
nRBC: 0 % (ref 0.0–0.2)

## 2019-02-26 LAB — PHOSPHORUS
Phosphorus: 6 mg/dL — ABNORMAL HIGH (ref 2.5–4.6)
Phosphorus: 5.6 mg/dL — ABNORMAL HIGH (ref 2.5–4.6)

## 2019-02-26 LAB — COMPREHENSIVE METABOLIC PANEL
ALT: 19 U/L (ref 0–44)
AST: 44 U/L — ABNORMAL HIGH (ref 15–41)
Albumin: 1.5 g/dL — ABNORMAL LOW (ref 3.5–5.0)
Alkaline Phosphatase: 117 U/L (ref 38–126)
Anion gap: 14 (ref 5–15)
BUN: 75 mg/dL — ABNORMAL HIGH (ref 8–23)
CO2: 17 mmol/L — ABNORMAL LOW (ref 22–32)
Calcium: 6.9 mg/dL — ABNORMAL LOW (ref 8.9–10.3)
Chloride: 102 mmol/L (ref 98–111)
Creatinine, Ser: 3.74 mg/dL — ABNORMAL HIGH (ref 0.61–1.24)
GFR calc Af Amer: 18 mL/min — ABNORMAL LOW (ref >60)
GFR calc non Af Amer: 16 mL/min — ABNORMAL LOW (ref >60)
Glucose, Bld: 194 mg/dL — ABNORMAL HIGH (ref 70–99)
Potassium: 3.8 mmol/L (ref 3.5–5.1)
Sodium: 133 mmol/L — ABNORMAL LOW (ref 135–145)
Total Bilirubin: 2.6 mg/dL — ABNORMAL HIGH (ref 0.3–1.2)
Total Protein: 6.5 g/dL (ref 6.5–8.1)

## 2019-02-26 LAB — GLUCOSE, CAPILLARY
Glucose-Capillary: 131 mg/dL — ABNORMAL HIGH (ref 70–99)
Glucose-Capillary: 146 mg/dL — ABNORMAL HIGH (ref 70–99)
Glucose-Capillary: 154 mg/dL — ABNORMAL HIGH (ref 70–99)
Glucose-Capillary: 179 mg/dL — ABNORMAL HIGH (ref 70–99)
Glucose-Capillary: 188 mg/dL — ABNORMAL HIGH (ref 70–99)
Glucose-Capillary: 262 mg/dL — ABNORMAL HIGH (ref 70–99)

## 2019-02-26 LAB — BPAM PLATELET PHERESIS
Blood Product Expiration Date: 202011152359
ISSUE DATE / TIME: 202011121239
Unit Type and Rh: 7300

## 2019-02-26 LAB — MAGNESIUM
Magnesium: 1.6 mg/dL — ABNORMAL LOW (ref 1.7–2.4)
Magnesium: 1.9 mg/dL (ref 1.7–2.4)

## 2019-02-26 LAB — PREPARE PLATELET PHERESIS: Unit division: 0

## 2019-02-26 LAB — LACTIC ACID, PLASMA: Lactic Acid, Venous: 4.5 mmol/L (ref 0.5–1.9)

## 2019-02-26 MED ORDER — PRO-STAT SUGAR FREE PO LIQD
30.0000 mL | Freq: Every day | ORAL | Status: DC
  Administered 2019-02-26: 30 mL
  Filled 2019-02-26: qty 30

## 2019-02-26 MED ORDER — SODIUM BICARBONATE-DEXTROSE 150-5 MEQ/L-% IV SOLN
150.0000 meq | INTRAVENOUS | Status: DC
  Administered 2019-02-26: 150 meq via INTRAVENOUS
  Filled 2019-02-26: qty 1000

## 2019-02-26 MED ORDER — VITAL 1.5 CAL PO LIQD
1000.0000 mL | ORAL | Status: DC
  Administered 2019-02-26 – 2019-02-27 (×2): 1000 mL
  Filled 2019-02-26 (×4): qty 1000

## 2019-02-26 MED ORDER — LACTATED RINGERS IV BOLUS
1000.0000 mL | Freq: Once | INTRAVENOUS | Status: AC
  Administered 2019-02-26: 1000 mL via INTRAVENOUS

## 2019-02-26 MED ORDER — MAGNESIUM SULFATE 2 GM/50ML IV SOLN
2.0000 g | Freq: Once | INTRAVENOUS | Status: AC
  Administered 2019-02-26: 2 g via INTRAVENOUS
  Filled 2019-02-26: qty 50

## 2019-02-26 NOTE — Progress Notes (Signed)
  Speech Language Pathology Treatment: Dysphagia  Patient Details Name: Luke Garcia MRN: [id number] DOB: 11-29-1950 Today's Date: 02/26/2019 Time: 3267-1245 SLP Time Calculation (min) (ACUTE ONLY): 12 min  Assessment / Plan / Recommendation Clinical Impression  Pt seen for ongoing swallowing treatment. RN recently completed oral care.  Pt eager for PO trials. Prompt oral response noted with ice chips, but prolonged oral phase noted with suspected premature spillage and cough noted prior to swallow.  There was immediate cough with roughly 60% of trials of ice chips and water by teaspoon. Watery eyes noted as well, which also occurred in absence of cough.  Pt's cough reflex seemingly weak with fair-poor clearance.  Attempted swallowing exercises with pt to improve swallow function.  Pt completed 10 effortful swallows with bolus trials, although SLP was unable to assess level of effort.  Pt attempted CTAR x3, but with limited movement and very little force against resistance.  Pt appears to weak at this time to meaningfully participate in exercise program. Recommend pt remain NPO at present.  Pt would likely benefit from temporary alternate means of nutrition.   HPI HPI: [name]  is a 68 y.o. male with past medical history relevant for prior history of alcohol abuse, patient denies current use, hypertension and DM who  lives in camper behind friends house-presents to the ED with shortness of breath and noted to have multiple bruises on body. MRI reveals "There is a punctate focus of restricted diffusion in the right cerebellar hemisphere reflecting acute infarct." Pt seen at [facility] with MBSS 11/11.  Pt NPO with no AMN at present      SLP Plan  Continue with current plan of care       Recommendations  Diet recommendations: NPO Medication Administration: Via alternative means                Oral Care Recommendations: Oral care QID;Oral care prior to ice chip/H20;Staff/trained  caregiver to provide oral care Follow up Recommendations: 24 hour supervision/assistance SLP Visit Diagnosis: Dysphagia, oropharyngeal phase (R13.12) Plan: Continue with current plan of care       [place], [place], [place] Office: [phone]; Pager [phone]): [phone] (via text page app only) 02/26/2019, 9:51 AM

## 2019-02-26 NOTE — Consult Note (Signed)
Consultation Note Date: 02/26/2019   Patient Name: Luke Garcia  DOB: [birth year]  MRN: [id number]  Age / Sex: 68 y.o., male  PCP: Patient, No Pcp Per Referring Physician: Collene Gobble, MD  Reason for Consultation: Establishing goals of care  HPI/Patient Profile: 68 y.o. male  with past medical history of DM2- not controlled, HTN admitted on 03/14/2019 with shortness of breath, hyperglycemia, AKI on CKD, A fib with RVR. Further workup during admission reveals ischemic cerebellar infarct with residual L extemity weakness, dysarthria and dysphagia (barrium swallow showing silent aspiration- pt now NPO with cortrak in place.); CHF with sepsis bacteremia r/t vegetation on tricuspid valve, hepatic cirrhosis (MELD 31 indicating 52.6% 3 month mortality); severe thrombocytopenia ?hepatic origin, ?infectious, ?HITT. He initially required levophed but has weaned off. CT surgery consulted and recommended TEE with possible catheter debridement of vegetation if vegetation is primarily atrial. TEE is scheduled for Monday 11/16. Cr is not trending up. Given multiple comorbidities patient likely has poor prognosis. Palliative consulted for [place].    Clinical Assessment and Goals of Care: Consult received and chart reviewed.  Evaluated patient- he was in bed- tells me he is at [facility]. He is unable to tell me his why he is in the hospital. I attempted to discuss his medical problems with him at his request. He did not appear to comprehend his medical situation. I briefly took off his mitt at his request and he immediately attempted to pull out his cortrack and could not be redirected. I asked him if he wanted it out, and he said "Yes". I asked him if he would want to keep it if it was the only way to provide him nutrition and sustain his life- to which he did not answer- only stared blankly at me.  Attempted to discuss [place]  and wishes regarding medical care- he was unable to participate- only saying, "I don't know" when any questions were asked.  I have called and left a message for his son- Samvel Zinn who would be his surrogate Media planner.   Primary Decision Maker NEXT OF KIN- son- Alecsander Pereira    SUMMARY OF RECOMMENDATIONS  -Pt unable to participate in meaningful [place] discussion -PMT will attempt to contact son- Cora Stetson- have left message requesting return call   CODE Status/Advance Care Planning:  Full code   Additional Recommendations (Limitations, Scope, Preferences):  Full Scope Treatment  Prognosis:    Unable to determine  Discharge Planning: To Be Determined  Primary Diagnoses: Present on Admission: . HTN (hypertension) . DM (diabetes mellitus), type 2, uncontrolled with complications ([place]) . A-fib, unknown duration . Leukocytosis . 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 Acute lower UTI . Endocarditis . AKI (acute kidney injury) ([place]) . Staphylococcus epidermidis bacteremia . Alcoholic cirrhosis of liver without ascites ([place]) . Thrombocytopenia ([place]) . DIC (disseminated intravascular coagulation) ([place]) . Metabolic acidosis . Acute encephalopathy . Sepsis ([place])   I have reviewed the medical record, interviewed the patient and family,  and examined the patient. The following aspects are pertinent.  Past Medical History:  Diagnosis Date  . Atrial fibrillation (HCC)   . Diabetes mellitus without complication (HCC)   . Hypertension    Social History   Socioeconomic History  . Marital status: Single    Spouse name: Not on file  . Number of children: Not on file  . Years of education: Not on file  . Highest education level: Not on file  Occupational History  . Not on file  Social Needs  . Financial resource strain: Not on file  . Food insecurity    Worry: Not on file    Inability: Not on file  . Transportation needs    Medical: Not on file     Non-medical: Not on file  Tobacco Use  . Smoking status: Never Smoker  . Smokeless tobacco: Never Used  Substance and Sexual Activity  . Alcohol use: Yes    Comment: Beer and vodka  . Drug use: No  . Sexual activity: Never  Lifestyle  . Physical activity    Days per week: Not on file    Minutes per session: Not on file  . Stress: Not on file  Relationships  . Social Musicianconnections    Talks on phone: Not on file    Gets together: Not on file    Attends religious service: Not on file    Active member of club or organization: Not on file    Attends meetings of clubs or organizations: Not on file    Relationship status: Not on file  Other Topics Concern  . Not on file  Social History Narrative  . Not on file   Family History  Problem Relation Age of Onset  . Cancer - Other Mother   . Pancreatic cancer Father   . Congestive Heart Failure Father    Scheduled Meds: . atorvastatin  80 mg Oral q1800  . Chlorhexidine Gluconate Cloth  6 each Topical Daily  . feeding supplement (PRO-STAT SUGAR FREE 64)  30 mL Per Tube Daily  . folic acid  1 mg Intravenous Daily  . insulin aspart  2-6 Units Subcutaneous Q4H  . multivitamin with minerals  1 tablet Oral Daily  . thiamine injection  100 mg Intravenous Daily   Continuous Infusions: . sodium chloride Stopped (02/25/19 2238)  .  ceFAZolin (ANCEF) IV Stopped (02/26/19 1011)  . famotidine (PEPCID) IV Stopped (02/26/19 1100)  . feeding supplement (VITAL 1.5 CAL)     PRN Meds:.acetaminophen **OR** acetaminophen, albuterol, LORazepam, ondansetron **OR** ondansetron (ZOFRAN) IV, polyethylene glycol Medications Prior to Admission:  Prior to Admission medications   Medication Sig Start Date End Date Taking? Authorizing Provider  metFORMIN (GLUCOPHAGE) 500 MG tablet Take 1 tablet (500 mg total) by mouth 2 (two) times daily with a meal. Patient taking differently: Take 500 mg by mouth daily as needed.  03/12/12  Yes Cristal Fordeddy, Srikar A, MD   carvedilol (COREG) 12.5 MG tablet Take 1 tablet (12.5 mg total) by mouth 2 (two) times daily with a meal. Patient not taking: Reported on 02/18/2019 03/12/12   Cristal Fordeddy, Srikar A, MD  folic acid (FOLVITE) 1 MG tablet Take 1 tablet (1 mg total) by mouth daily. Patient not taking: Reported on 02/27/2019 03/12/12   Cristal Fordeddy, Srikar A, MD  glipiZIDE (GLUCOTROL) 2.5 mg TABS Take 0.5 tablets (2.5 mg total) by mouth 2 (two) times daily before a meal. Patient not taking: Reported on 02/20/2019 03/12/12   Cristal Fordeddy, Srikar A, MD  lisinopril (PRINIVIL,ZESTRIL) 5 MG tablet Take 1 tablet (5 mg total) by mouth daily. Patient not taking: Reported on 02/17/2019 03/12/12   Cristal Ford, MD  lovastatin (MEVACOR) 20 MG tablet Take 1 tablet (20 mg total) by mouth at bedtime. Patient not taking: Reported on 02/17/2019 03/12/12   Cristal Ford, MD  Multiple Vitamin (MULTIVITAMIN WITH MINERALS) TABS Take 1 tablet by mouth daily. Patient not taking: Reported on 02/16/2019 03/12/12   Cristal Ford, MD  thiamine 100 MG tablet Take 1 tablet (100 mg total) by mouth daily. Patient not taking: Reported on 02/20/2019 03/12/12   Cristal Ford, MD   Allergies  Allergen Reactions  . Aspirin Anaphylaxis  . Nsaids Anaphylaxis  . Rhubarb   . Strawberry Extract   . Tylenol [Acetaminophen]    Review of Systems  Unable to perform ROS: Mental status change    Physical Exam Vitals signs and nursing note reviewed.  Cardiovascular:     Rate and Rhythm: Rhythm irregular.  Pulmonary:     Effort: Pulmonary effort is normal.  Skin:    Coloration: Skin is jaundiced.     Findings: Ecchymosis and petechiae present.  Neurological:     Mental Status: He is alert.     Comments: Oriented to person and place, not to situation, dysarthria  Psychiatric:        Attention and Perception: He is inattentive.        Behavior: Behavior is slowed.        Cognition and Memory: Cognition is impaired.        Judgment: Judgment is impulsive.      Vital Signs: BP 96/61   Pulse 83   Temp (!) 97 F (36.1 C) (Rectal)   Resp 18   Ht 6\' 1"  (1.854 m)   Wt 98.1 kg   SpO2 100%   BMI 28.53 kg/m  Pain Scale: CPOT POSS *See Group Information*: S-Acceptable,Sleep, easy to arouse Pain Score: 0-No pain   SpO2: SpO2: 100 % O2 Device:SpO2: 100 % O2 Flow Rate: .   IO: Intake/output summary:   Intake/Output Summary (Last 24 hours) at 02/26/2019 1455 Last data filed at 02/26/2019 1200 Gross per 24 hour  Intake 3297.27 ml  Output 75 ml  Net 3222.27 ml    LBM: Last BM Date: 02/26/19(smear) Baseline Weight: Weight: 92.1 kg Most recent weight: Weight: 98.1 kg     Palliative Assessment/Data: PPS: 10%     Thank you for this consult. Palliative medicine will continue to follow and assist as needed.   Time In: 1330 Time Out: 1445 Time Total: 75 mins Greater than 50%  of this time was spent counseling and coordinating care related to the above assessment and plan.  Signed by: 02/28/19, AGNP-C Palliative Medicine    Please contact Palliative Medicine Team phone at [phone] for questions and concerns.  For individual provider: See [phone]

## 2019-02-26 NOTE — Progress Notes (Signed)
Initial Nutrition Assessment  RD working remotely.  DOCUMENTATION CODES:   Not applicable  INTERVENTION:   Tube feeding via Cortrak: - Vital 1.5 @ 60 ml/hr (1440 ml/day) - Pro-stat 30 ml daily  Tube feeding regimen provides 2260 kcal, 112 grams of protein, and 1100 ml of H2O.  NUTRITION DIAGNOSIS:   Inadequate oral intake related to dysphagia as evidenced by NPO status.  GOAL:   Patient will meet greater than or equal to 90% of their needs  MONITOR:   Diet advancement, Labs, Weight trends, Skin, I & O's, TF tolerance  REASON FOR ASSESSMENT:   Consult Enteral/tube feeding initiation and management  ASSESSMENT:   68 year old male who presented to the ED on 11/09 with SOB. PMH of T2DM, HTN, EtOH abuse. 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   50/53 - episode of speech disturbance and unresponsiveness, MRI suggesting cerebellar infarct, SLP recommending Dysphagia 2 diet with honey-thick liquids 11/11 - MBSS with recommendations for NPO 11/12 - transferred to [facility] 11/13 - Cortrak placement, tip in stomach per Cortrak team  Pt with RLL pneumonia and small right pleural effusion. Pressors have been weaned off.  Noted plan for TEE.  RD consulted for TF initiation and management. Discussed plan with RN.  Weight up 13 lbs since admit. Will utilize weight of 91.9 kg as EDW. Per RN edema assessment, pt with deep pitting generalized edema as well as moderate to deep pitting edema to BUE and BLE.  Medications reviewed and include: folic acid, SSI, MVI with minerals, thiamine, IV abx, IV Pepcid  Labs reviewed: sodium 133, BUN 75, creatinine 3.74, phosphorus 6.0, magnesium 1.6, lactic acid 4.5, hemoglobin 8.1 CBG's: 154-262 x 24 hours  I/O's: +3.7 L since admit  NUTRITION - FOCUSED PHYSICAL EXAM:  Unable to complete at this time. RD working remotely.  Diet Order:   Diet Order           Diet NPO time specified  Diet effective now              EDUCATION NEEDS:   No education needs have been identified at this time  Skin:  Skin Assessment: Skin Integrity Issues: DTI: sacrum, left heel  Last BM:  02/26/19 smear  Height:   Ht Readings from Last 1 Encounters:  02/25/19 6\' 1"  (1.854 m)    Weight:   Wt Readings from Last 1 Encounters:  02/25/19 98.1 kg    Ideal Body Weight:  83.6 kg  BMI:  Body mass index is 28.53 kg/m.  Estimated Nutritional Needs:   Kcal:  2200-2400  Protein:  110-130 grams  Fluid:  >/= 2.0 L    Gaynell Face, MS, RD, LDN Inpatient Clinical Dietitian Pager: [phone] Weekend/After Hours: [phone]

## 2019-02-26 NOTE — Progress Notes (Signed)
PT Cancellation Note  Patient Details Name: Luke Garcia MRN: [id number] DOB: 10-25-1950   Cancelled Treatment:    Reason Eval/Treat Not Completed: (P) Medical issues which prohibited therapy Pt has had a decline and is currently bradycardic. RN requests PT be deferred this morning. PT will follow back as able for treatment.  [name] B. Migdalia Dk PT, DPT Acute Rehabilitation Services Pager [phone] Office [phone]    [place] 02/26/2019, 8:59 AM

## 2019-02-26 NOTE — Progress Notes (Signed)
[facility] Progress Note Patient Name: Luke Garcia DOB: 08/10/1950 MRN: [id number]   Date of Service  02/26/2019  HPI/Events of Note  Pt with endocarditis and sepsis, lactate is 4.5, he's had 1.7 liters of iv fluid so far during this hospitalization. Mg 1.6.  eICU Interventions  Lactated Ringers 1000 ml iv fluid bolus x 1, mgSo4 2 gm iv x 1        Mitzi Lilja U Tomasita Beevers 02/26/2019, 6:00 AM

## 2019-02-26 NOTE — Progress Notes (Signed)
CRITICAL VALUE ALERT  Critical Value:  Lactic acid 4.5, magnesium-1.6, platelets-26  Date & Time Notified:  02/26/19 5:28 AM   Provider Notified: Warren Lacy  Orders Received/Actions taken: Awaiting new orders. Will continue to monitor.

## 2019-02-26 NOTE — Progress Notes (Signed)
Pts phos 5.6, was 6 this AM. Creatinine has been elevated.  Relayed to CCM, no new orders for now, repeat labs in AM.

## 2019-02-26 NOTE — Progress Notes (Addendum)
Bladder scan showing 429 ml. Foley inserted w/ urine return. Urine output only 25 ml.  Foley irrigated w/ 66ml nacl, 20 ml returned.  Urine cloudy/pink, blood clots noted. Will relay to night shift.

## 2019-02-26 NOTE — Progress Notes (Signed)
NAME:  Luke Garcia, MRN:  [id number], DOB:  [birth year], LOS: 3 ADMISSION DATE:  26-Feb-2019, CONSULTATION DATE:  02/25/19 REFERRING MD:  [facility]   CHIEF COMPLAINT:  weakness   Brief History   Luke Garcia is a 68 y.o. male who was admitted to [facility] 11/9 with AFRVR, hyperglycemia, AKI.  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 Transferred to [facility] for CT Surgery Evaluation   History of present illness    Luke Garcia is a 68 y.o. male who has a PMH including but not limited to HTN, DM, A.fib, EtOH abuse.  He presented to [facility] ED 11/9 with dyspnea and generalized weakness.  He apparently lives in a camper behind a friends house.  In ED he was found to have AFRVR with HR in 150s, CBG > 500, AKI.  He was admitted by [facility] and started on cardizem gtt.  Anticoagulation was held due to significant thrombocytopenia.  After admission, echo revealed LVEF 60-65%, severe LVH, normal RV, severe biatrial enlargement, large tricuspid valve vegetation.  Infectious workup revealed staph epidermidis (oxa S) in 4/4 bottles.  He was started on vanc and ceftriaxone.  ID was consulted and abx were switched to single therapy cefazolin.  CT head was negative but brain MRI revealed acute right cerebellar infarct.    On 11/12, he became hypotensive and was subsequently started on levophed.  He was later transferred to [facility] for further evaluation and management including TCTS consult given TV vegetation.  Past Medical History  11/9 > admit. 11/12 > transfer to [facility]  Significant Hospital Events   HTN, DM, A.fib, EtOH abuse.  Consults:  Cardiology, Neurology, ID, PCCM.  Procedures:  None.  Significant Diagnostic Tests:  CT Head 11/9 > no acute process. MRI brain 11/10 > small right cerebellar infarct. Abd [place] 11/10 > cirrhosis, small volume ascites. Echo 11/10 > EF 60-65%, severely dilated LA and RA, TV vegetation. EEG 11/12 > normal. CT chest 11/13 >> right lower lobe patchy infiltrate with associated small  effusion, 5 mm subsolid right basilar nodule.  Question poorly formed left lower lobe nodule  Micro Data:  Blood 11/9 > Staph epidermidis. SARS CoV2 11/9 > neg.  Antimicrobials:  Ceftriaxone 11/9 > 11/12. Vanc 11/9 > 11/10. Cefazolin 11/12 >   Interim history/subjective:  Pressors have been weaned off Bicarbonate drip infusing  Objective:  Blood pressure (!) 91/55, pulse 78, temperature (!) 96.3 F (35.7 C), temperature source Rectal, resp. rate 16, height 6\' 1"  (1.854 m), weight 98.1 kg, SpO2 100 %.        Intake/Output Summary (Last 24 hours) at 02/26/2019 0946 Last data filed at 02/26/2019 0900 Gross per 24 hour  Intake 3126.75 ml  Output -  Net 3126.75 ml   Filed Weights   02/25/19 0500 02/25/19 1805 02/25/19 2200  Weight: 95.7 kg 95.6 kg 98.1 kg    Examination: General: Ill-appearing elderly man laying comfortably in bed Neuro: He is awake, slow to respond with some slurred speech HEENT: Oropharynx is dry, pupils are equal Cardiovascular: Irregular, normal rate, distant no murmur Lungs: Decreased bilateral breath sounds, no crackles or wheezes Abdomen: Soft, obese, nondistended with positive bowel sounds Musculoskeletal: Widespread edema.  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 Right lower lobe pneumonia, small bilateral LL pulmonary nodules, presumed embolic Small right pleural effusion Plan -Pressors weaned  to off -Cefazolin as ordered.  Appreciate infectious diseases assistance and input, adjust as they see fit particularly with right basilar infiltrate -Will ask TCTS to evaluate 11/13 -May need TEE, will defer timing of this to TCTS depending on whether it will help management -May need to consider right thoracentesis if effusion enlarges, clinical suspicion empyema -Hold off stress dose  steroids, a.m. cortisol 25.5  A.fib with RVR.  Now with adequate rate control Plan -Deferring anticoagulation given thrombocytopenia -Beta-blockade held prior to transfer  Acute Kidney Injury  Lactic Acidosis  Plan  -Currently on bicarbonate drip, plan to stop 11/13 -Consider nephrology evaluation if urine output and renal function do not continue to improve -Follow BMP   Encephalopathy in setting of sepsis and uremia Right cerebellar CVA, new. Questionable seizure activity noted OSH at time of CVA Plan -Keppra stopped by neurology 11/12 -Question need for TEE to evaluate for PFO or left-sided valvular disease  Dysphagia. Plan -N.p.o. for now.  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 EtOH abuse. Plan -Thiamine, folate as ordered -Will need GI follow-up, evaluation as an outpatient  Severe thrombocytopenia - multifactorial in setting of sepsis and cirrhosis.   -Peripheral smear neg for schistocytes Initial question HIV - screen pos but Ab neg. Plan. -HIV viral load confirms negative -Follow CBC, no schistocytes as above -Suspicion for HITT low based on clinical scenario   DM. Plan -Sliding-scale insulin as per protocol   Best Practice:  Diet: NPO DVT prophylaxis: SCD's GI prophylaxis: Pepcid  Glucose control: SSI Mobility: Bedrest Code Status: FC Family Communication: Updated by Dr. Rande Lawman 11/12 Disposition: ICU  Labs   CBC: Recent Labs  Lab 02/17/2019 1135 02/23/19 0425 02/24/19 0407 02/25/19 0530 02/25/19 1119 02/26/19 0330  WBC 26.6* 28.4* 23.8* 29.5*  --  31.2*  NEUTROABS  --   --   --  24.3*  --  25.6*  HGB 8.6* 7.6* 8.9* 8.7*  --  8.1*  HCT 26.6* 23.6* 28.6* 27.4*  --  24.5*  MCV 113.7* 113.5* 117.2* 115.6*  --  112.9*  PLT 37* 34* 21* 9* 14* 26*   Basic Metabolic Panel: Recent Labs  Lab 03/06/2019 1729 02/23/19 0425 02/24/19 0407 02/25/19 0530 02/25/19 2222 02/26/19 0330  NA  --  130* 129* 130*  132* 133*  K  --  3.7 3.8 4.7 4.4 3.8  CL  --  100 99 100 100 102  CO2  --  18* 18* 16* 17* 17*  GLUCOSE  --  231* 181* 233* 217* 194*  BUN  --  52* 59* 74* 75* 75*  CREATININE  --  2.19* 2.39* 3.15* 3.60* 3.74*  CALCIUM  --  7.3* 7.4* 7.4* 7.1* 6.9*  MG 1.2*  --   --   --  1.3* 1.6*  PHOS  --   --   --   --   --  6.0*   GFR: Estimated Creatinine Clearance: 23.6 mL/min (A) (by C-G formula based on SCr of 3.74 mg/dL (H)). Recent Labs  Lab 02/23/19 0425 02/24/19 0407 02/25/19 0530 02/25/19 1718 02/25/19 1952 02/25/19 2222 02/26/19 0330  WBC 28.4* 23.8* 29.5*  --   --   --  31.2*  LATICACIDVEN  --   --   --  2.7* 3.2* 3.2* 4.5*   Liver Function Tests: Recent Labs  Lab 03/08/2019 1148 02/24/19 0407 02/26/19 0330  AST 35 31 44*  ALT 19 17 19   ALKPHOS 137* 123 117  BILITOT 4.6*  4.0* 2.6*  PROT 7.3 6.8 6.5  ALBUMIN 1.9* 1.8* 1.5*   No results for input(s): LIPASE, AMYLASE in the last 168 hours. Recent Labs  Lab 02/24/19 0407  AMMONIA 25   ABG No results found for: PHART, PCO2ART, PO2ART, HCO3, TCO2, ACIDBASEDEF, O2SAT  Coagulation Profile: Recent Labs  Lab 02/23/19 1142 02/25/19 0948 02/25/19 1119  INR 1.8* 2.0* 1.9*   Cardiac Enzymes: No results for input(s): CKTOTAL, CKMB, CKMBINDEX, TROPONINI in the last 168 hours. HbA1C: Hgb A1c MFr Bld  Date/Time Value Ref Range Status  24-Feb-2019 05:27 PM 6.8 (H) 4.8 - 5.6 % Final    Comment:    (NOTE) Pre diabetes:          5.7%-6.4% Diabetes:              >6.4% Glycemic control for   <7.0% adults with diabetes   03/11/2012 10:03 AM 9.3 (H) <5.7 % Final    Comment:    (NOTE)                                                                       According to the ADA Clinical Practice Recommendations for 2011, when HbA1c is used as a screening test:  >=6.5%   Diagnostic of Diabetes Mellitus           (if abnormal result is confirmed) 5.7-6.4%   Increased risk of developing Diabetes Mellitus References:Diagnosis  and Classification of Diabetes Mellitus,Diabetes Care,2011,34(Suppl 1):S62-S69 and Standards of Medical Care in         Diabetes - 2011,Diabetes Care,2011,34 (Suppl 1):S11-S61.   CBG: Recent Labs  Lab 02/25/19 2150 02/25/19 2243 02/26/19 0020 02/26/19 0329 02/26/19 0811  GLUCAP 207* 207* 262* 179* 154*      Critical care time: 32 minutes     Levy Pupaobert Sache Sane, MD, PhD 02/26/2019, 10:22 AM [facility] [phone] or if no answer [phone]

## 2019-02-26 NOTE — Plan of Care (Signed)
  Problem: Pain Managment: Goal: General experience of comfort will improve Outcome: Progressing   Problem: Activity: Goal: Risk for activity intolerance will decrease Outcome: Not Progressing Note: Pt confused and very weak. Unable to mobilize at this time    Problem: Nutrition: Goal: Adequate nutrition will be maintained Outcome: Not Progressing Note: Pt currently NPO. Will discuss with healthcare team to address diet   Problem: Elimination: Goal: Will not experience complications related to bowel motility Outcome: Not Progressing Note: Pt has not had BM since 11/7. Will address with rounding MD  Goal: Will not experience complications related to urinary retention Outcome: Not Progressing Note: RN bladder scanned pt with greater than 390cc resulted. RN did I/O cath but was unable to drain more than 50cc. Will discuss with rounding MD

## 2019-02-26 NOTE — Progress Notes (Signed)
   Luke Garcia has been requested to perform a transesophageal echocardiogram on Luke Garcia for evaluation of endocarditis. Per chart review, patient disoriented today. He also has significant thrombocytopenia with platelets of 26,000 today and renal failure with creatinine of 3.74. He also has dysphagia and barium swallow study on 02/24/2019 showed silent aspiration.  TEE tentatively scheduled for Monday 03/27/19 at 12:45am with Dr. Audie Box. Will go ahead and make patient NPO at midnight prior to procedure. However, will wait to consent patient and see if patient's condition improves some. Someone from our team will need to go by and consent patient on Monday morning.  Darreld Mclean, PA-C 02/26/2019 2:41 PM

## 2019-02-26 NOTE — Consult Note (Signed)
[facility]    Date of Admission:  13-Mar-2019     Total days of antibiotics 4               Reason for Consult: Bacteremia / Endocarditis  Referring Provider: [facility] Primary Care Provider: Patient, No Pcp Per   ASSESSMENT:  Luke Garcia is a 68 y/o male with Staphylococcus epidermidis bacteremia complicated by tricuspid valve endocarditis with mobile vegetation seen on TEE. Will need TEE to confirm and evaluate heart valve structure and function. Source of infection unclear at present. Current health status complicated by poorly controlled diabetes (which may be related to infection), thrombocytopenia and atrial fibrillation with RVR which is now improved. Repeat cultures from 11/11 and 11/12 have remained without growth to date. Pharmacy will confirm with microbiology susceptibility pattern to ensure true infection. CVTS being consulted by Critical Care team. Will continue current dose of Cefazolin likely needing at least 6 weeks of therapy. Previous HIV testing confirmed to be negative with no viral load.   PLAN:  1. Continue Cefazolin. 2. Monitor blood cultures for clearance of bacteremia. 3. Atrial fibrillation per primary team.    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 atorvastatin  80 mg Oral q1800  . Chlorhexidine Gluconate Cloth  6  each Topical Daily  . folic acid  1 mg Intravenous Daily  . insulin aspart  2-6 Units Subcutaneous Q4H  . multivitamin with minerals  1 tablet Oral Daily  . thiamine injection  100 mg Intravenous Daily     HPI: Luke Garcia is a 68 y.o. male with previous medical history of atrial fibrillation, type 2 diabetes, alcohol use and hypertension admitted to the hospital on 11/9 with shortness of breath, fever, and atrial fibrillation with RVR.  He currently lives in a camper behind a friends house.   On arrival to the ED he was found to be tachycardic and afebrile.  Blood sugars as previously mentioned were greater than 500 and creatinine elevated at 2.1.Blood sugars were elevated greater than 500.  White blood cell count of 26,000 and platelet count of 34.  Transferred [facility] from [facility] with blood cultures positive for Staph epidermidis in 4/ 4 bottles.  ID initially consulted and antibiotics changed to cefazolin.  TTE reviewed showing a large mobile vegetation to tricuspid valve measuring 1.8 x 2.4 cm.   Luke Garcia has been afebrile since admission with elevating leukocytosis with most recent white blood cell count of 31.2.  CT scan of the chest with bilateral pleural effusions and patchy right lower lobe infiltrate.;  5 mm subsolid right upper lobe nodule; and ascites in the abdomen.  Ultrasound of the abdomen from 11/10 with irregular nodular contour of the liver suggesting cirrhosis.  Currently on day 4 of total antimicrobial therapy with cefazolin having previously received 3 days of ceftriaxone  and vancomycin.  Repeat blood cultures from 11/11 and 11/12 have remained without growth to date.  Question of reactive HIV testing which was confirmed as a false positive with negative antibody and no viral load present.  Review of Systems: Review of Systems  Constitutional: Negative for chills, fever and weight loss.  Respiratory: Negative for cough, shortness of breath and wheezing.    Cardiovascular: Negative for chest pain and leg swelling.  Gastrointestinal: Negative for abdominal pain, constipation, diarrhea, nausea and vomiting.  Skin: Negative for rash.     Past Medical History:  Diagnosis Date  . Atrial fibrillation (HCC)   . Diabetes mellitus without complication (HCC)   . Hypertension     Social History   Tobacco Use  . Smoking status: Never Smoker  . Smokeless tobacco: Never Used  Substance Use Topics  . Alcohol use: Yes    Comment: Beer and vodka  . Drug use: No    Family History  Problem Relation Age of Onset  . Cancer - Other Mother   . Pancreatic cancer Father   . Congestive Heart Failure Father     Allergies  Allergen Reactions  . Aspirin Anaphylaxis  . Nsaids Anaphylaxis  . Rhubarb   . Strawberry Extract   . Tylenol [Acetaminophen]     OBJECTIVE: Blood pressure (!) 91/55, pulse 78, temperature (!) 96.3 F (35.7 C), temperature source Rectal, resp. rate 16, height 6\' 1"  (1.854 m), weight 98.1 kg, SpO2 100 %.  Physical Exam Constitutional:      General: He is not in acute distress.    Appearance: He is well-developed. He is ill-appearing.     Comments: Lying in bed with head of bed elevated; pleasant but confused  Cardiovascular:     Rate and Rhythm: Normal rate. Rhythm irregularly irregular.     Heart sounds: Normal heart sounds.  Pulmonary:     Effort: Pulmonary effort is normal.     Breath sounds: Normal breath sounds.  Abdominal:     General: There is no distension.     Tenderness: There is no abdominal tenderness. There is no rebound.  Skin:    General: Skin is warm and dry.     Comments: Punctuate hemorrhage noted on bilateral feet.   Neurological:     Mental Status: He is alert. He is disoriented.  Psychiatric:        Behavior: Behavior normal.        Thought Content: Thought content normal.     Lab Results Lab Results  Component Value Date   WBC 31.2 (H) 02/26/2019   HGB 8.1 (L) 02/26/2019   HCT 24.5  (L) 02/26/2019   MCV 112.9 (H) 02/26/2019   PLT 26 (LL) 02/26/2019    Lab Results  Component Value Date   CREATININE 3.74 (H) 02/26/2019   BUN 75 (H) 02/26/2019   NA 133 (L) 02/26/2019   K 3.8 02/26/2019   CL 102 02/26/2019   CO2 17 (L) 02/26/2019    Lab Results  Component Value Date   ALT 19 02/26/2019   AST 44 (H) 02/26/2019   ALKPHOS 117 02/26/2019   BILITOT 2.6 (H) 02/26/2019     Microbiology: Recent Results (from the past 240 hour(s))  SARS CORONAVIRUS 2 (TAT 6-24 HRS) Nasopharyngeal Nasopharyngeal Swab     Status: None   Collection Time: 02-24-19 11:43 AM   Specimen: Nasopharyngeal Swab  Result Value Ref Range Status   SARS Coronavirus 2 NEGATIVE NEGATIVE Final    Comment: (NOTE)  SARS-CoV-2 target nucleic acids are NOT DETECTED. The SARS-CoV-2 RNA is generally detectable in upper and lower respiratory specimens during the acute phase of infection. Negative results do not preclude SARS-CoV-2 infection, do not rule out co-infections with other pathogens, and should not be used as the sole basis for treatment or other patient management decisions. Negative results must be combined with clinical observations, patient history, and epidemiological information. The expected result is Negative. Fact Sheet for Patients: SugarRoll.be Fact Sheet for Healthcare Providers: https://www.woods-mathews.com/ This test is not yet approved or cleared by the [place] FDA and  has been authorized for detection and/or diagnosis of SARS-CoV-2 by FDA under an Emergency Use Authorization (EUA). This EUA will remain  in effect (meaning this test can be used) for the duration of the COVID-19 declaration under Section 56 4(b)(1) of the Act, 21 U.S.C. section 360bbb-3(b)(1), unless the authorization is terminated or revoked sooner. Performed at [facility], [place] [address] [place], [place] [address]   MRSA PCR Screening     Status: None    Collection Time: 03-23-2019  4:04 PM   Specimen: Nasal Mucosa; Nasopharyngeal  Result Value Ref Range Status   MRSA by PCR NEGATIVE NEGATIVE Final    Comment:        The GeneXpert MRSA Assay (FDA approved for NASAL specimens only), is one component of a comprehensive MRSA colonization surveillance program. It is not intended to diagnose MRSA infection nor to guide or monitor treatment for MRSA infections. Performed at [facility], [address], [place], [place] [address]   Culture, blood (Routine X 2) w Reflex to ID Panel     Status: Abnormal   Collection Time: 03/23/19  9:03 PM   Specimen: BLOOD  Result Value Ref Range Status   Specimen Description   Final    BLOOD RIGHT ANTECUBITAL Performed at [facility], [address], [place], [place] [address]    Special Requests   Final    BOTTLES DRAWN AEROBIC AND ANAEROBIC Blood Culture adequate volume Performed at [facility], [address], [place], [place] [address]    Culture  Setup Time   Final    GRAM POSITIVE COCCI IN BOTH AEROBIC AND ANAEROBIC BOTTLES PREVIOUSLY CALLED OTHER SET AT 1108A ON 111020 TO [place]. Performed at [facility], [address], [place], [place] [address]    Culture (A)  Final    STAPHYLOCOCCUS EPIDERMIDIS SUSCEPTIBILITIES PERFORMED ON PREVIOUS CULTURE WITHIN THE LAST 5 DAYS. Performed at [facility], [place] [address] [place], [place] [address]    Report Status 02/25/2019 FINAL  Final  Culture, blood (Routine X 2) w Reflex to ID Panel     Status: Abnormal   Collection Time: 03-23-19  9:08 PM   Specimen: BLOOD RIGHT HAND  Result Value Ref Range Status   Specimen Description   Final    BLOOD RIGHT HAND Performed at [facility], [address] [place], [place] [address]    Special Requests   Final    BOTTLES DRAWN AEROBIC AND ANAEROBIC Blood Culture adequate volume Performed at [facility], [address] [place], [place] [address]    Culture  Setup Time   Final     GRAM POSITIVE COCCI IN BOTH AEROBIC AND ANAEROBIC BOTTLES Gram Stain Report Called to,Read Back By and Verified With: HYLTON L. AT 1108A ON 111020 BY THOMPSON S. Organism ID to follow CRITICAL RESULT CALLED TO, READ BACK BY AND VERIFIED WITH: H TETREAULT  RN 02/23/19 1936 JDW Performed at [facility], [address], [place], [place] [address]    Culture STAPHYLOCOCCUS EPIDERMIDIS (A)  Final   Report Status 02/25/2019 FINAL  Final   Organism ID, Bacteria STAPHYLOCOCCUS EPIDERMIDIS  Final      Susceptibility   Staphylococcus epidermidis - MIC*    CIPROFLOXACIN <=0.5 SENSITIVE Sensitive     ERYTHROMYCIN >=8 RESISTANT Resistant     GENTAMICIN <=0.5 SENSITIVE Sensitive     OXACILLIN <=0.25 SENSITIVE Sensitive     TETRACYCLINE >=16 RESISTANT Resistant     VANCOMYCIN 1 SENSITIVE Sensitive     TRIMETH/SULFA <=10 SENSITIVE Sensitive     CLINDAMYCIN <=0.25 SENSITIVE Sensitive     RIFAMPIN <=0.5 SENSITIVE Sensitive     Inducible Clindamycin NEGATIVE Sensitive     * STAPHYLOCOCCUS EPIDERMIDIS  Blood Culture ID Panel (Reflexed)     Status: Abnormal   Collection Time: 02-25-2019  9:08 PM  Result Value Ref Range Status   Enterococcus species NOT DETECTED NOT DETECTED Final   Listeria monocytogenes NOT DETECTED NOT DETECTED Final   Staphylococcus species DETECTED (A) NOT DETECTED Final    Comment: Methicillin (oxacillin) susceptible coagulase negative staphylococcus. Possible blood culture contaminant (unless isolated from more than one blood culture draw or clinical case suggests pathogenicity). No antibiotic treatment is indicated for blood  culture contaminants. CRITICAL RESULT CALLED TO, READ BACK BY AND VERIFIED WITH: H TETREAULT RN 02/23/19 1936 JDW    Staphylococcus aureus (BCID) NOT DETECTED NOT DETECTED Final   Methicillin resistance NOT DETECTED NOT DETECTED Final   Streptococcus species NOT DETECTED NOT DETECTED Final   Streptococcus agalactiae NOT DETECTED NOT DETECTED Final    Streptococcus pneumoniae NOT DETECTED NOT DETECTED Final   Streptococcus pyogenes NOT DETECTED NOT DETECTED Final   Acinetobacter baumannii NOT DETECTED NOT DETECTED Final   Enterobacteriaceae species NOT DETECTED NOT DETECTED Final   Enterobacter cloacae complex NOT DETECTED NOT DETECTED Final   Escherichia coli NOT DETECTED NOT DETECTED Final   Klebsiella oxytoca NOT DETECTED NOT DETECTED Final   Klebsiella pneumoniae NOT DETECTED NOT DETECTED Final   Proteus species NOT DETECTED NOT DETECTED Final   Serratia marcescens NOT DETECTED NOT DETECTED Final   Haemophilus influenzae NOT DETECTED NOT DETECTED Final   Neisseria meningitidis NOT DETECTED NOT DETECTED Final   Pseudomonas aeruginosa NOT DETECTED NOT DETECTED Final   Candida albicans NOT DETECTED NOT DETECTED Final   Candida glabrata NOT DETECTED NOT DETECTED Final   Candida krusei NOT DETECTED NOT DETECTED Final   Candida parapsilosis NOT DETECTED NOT DETECTED Final   Candida tropicalis NOT DETECTED NOT DETECTED Final    Comment: Performed at [facility], [address] [place], [place] [address]  Culture, blood (Routine X 2) w Reflex to ID Panel     Status: None (Preliminary result)   Collection Time: 02/24/19  1:17 PM   Specimen: BLOOD RIGHT ARM  Result Value Ref Range Status   Specimen Description BLOOD RIGHT ARM  Final   Special Requests   Final    BOTTLES DRAWN AEROBIC AND ANAEROBIC Blood Culture adequate volume   Culture   Final    NO GROWTH 2 DAYS Performed at [facility], [address] [place], [place] [address]    Report Status PENDING  Incomplete  Culture, blood (Routine X 2) w Reflex to ID Panel     Status: None (Preliminary result)   Collection Time: 02/24/19  1:25 PM   Specimen: BLOOD RIGHT HAND  Result Value  Ref Range Status   Specimen Description BLOOD RIGHT HAND  Final   Special Requests   Final    BOTTLES DRAWN AEROBIC ONLY Blood Culture results may not be optimal due to an inadequate volume of  blood received in culture bottles   Culture   Final    NO GROWTH 2 DAYS Performed at [facility], [address], [place], [place] [address]    Report Status PENDING  Incomplete  Culture, blood (routine x 2)     Status: None (Preliminary result)   Collection Time: 02/25/19  6:17 PM   Specimen: BLOOD RIGHT ARM  Result Value Ref Range Status   Specimen Description BLOOD RIGHT ARM  Final   Special Requests   Final    BOTTLES DRAWN AEROBIC AND ANAEROBIC Blood Culture adequate volume   Culture   Final    NO GROWTH < 12 HOURS Performed at [facility], [address], [place], [place] [address]    Report Status PENDING  Incomplete  Culture, blood (routine x 2)     Status: None (Preliminary result)   Collection Time: 02/25/19  6:19 PM   Specimen: BLOOD RIGHT ARM  Result Value Ref Range Status   Specimen Description BLOOD RIGHT ARM  Final   Special Requests   Final    BOTTLES DRAWN AEROBIC AND ANAEROBIC Blood Culture adequate volume Performed at [facility], [address] [place], [place] [address]    Culture PENDING  Incomplete   Report Status PENDING  Incomplete     Marcos Eke, NP [facility] [phone] Pager  02/26/2019  10:27 AM \

## 2019-02-26 NOTE — Procedures (Signed)
Cortrak  Person Inserting Tube:  Luke Garcia, RD Tube Type:  Cortrak - 43 inches Tube Location:  Left nare Initial Placement:  Stomach Secured by: Bridle Technique Used to Measure Tube Placement:  Documented cm marking at nare/ corner of mouth Cortrak Secured At:  65 cm Procedure Comments:  Cortrak Tube Team Note:  Consult received to place a Cortrak feeding tube.   No x-ray is required. RN may begin using tube.   If the tube becomes dislodged please keep the tube and contact the Cortrak team at [email or web address] (password [email or web address]) for replacement.  If after hours and replacement cannot be delayed, place a NG tube and confirm placement with an abdominal x-ray.     Luke Goins, MS, RD, LDN, CNSC Inpatient Clinical Dietitian Pager # [phone] After hours/weekend pager # [phone]      

## 2019-02-26 NOTE — Progress Notes (Signed)
[place] Progress Note Patient Name: Luke Garcia DOB: May 12, 1950 MRN: [id number]   Date of Service  02/26/2019  HPI/Events of Note  Pt needs lactic acid draw per sepsis pathway.  eICU Interventions  Lactic acid level ordered.        [name] 02/26/2019, 12:44 AM

## 2019-02-26 NOTE — Progress Notes (Signed)
Responded to spiritual care consult for an AD. Pt was sleeping. Will follow up.   Chaplain Resident Fidel Levy [phone]

## 2019-02-26 NOTE — Progress Notes (Signed)
     [address]       [place] [address]             [phone]       Images and chart reviewed.  Staph epi endocarditis, unclear source.  Blood cultures negative.  Echo show mobile vegetation on tricuspid valve.  Unclear on the echo if there is a subvalvular component.  Recs: please obtain TEE.  If mostly atrial component, then he would be a candidate for catheter based debridement.  Sirius Woodford Bary Leriche

## 2019-02-27 ENCOUNTER — Inpatient Hospital Stay (HOSPITAL_COMMUNITY): Payer: Medicaid Other

## 2019-02-27 DIAGNOSIS — D65 Disseminated intravascular coagulation [defibrination syndrome]: Secondary | ICD-10-CM

## 2019-02-27 DIAGNOSIS — K7030 Alcoholic cirrhosis of liver without ascites: Secondary | ICD-10-CM

## 2019-02-27 DIAGNOSIS — Z7189 Other specified counseling: Secondary | ICD-10-CM

## 2019-02-27 DIAGNOSIS — N179 Acute kidney failure, unspecified: Secondary | ICD-10-CM

## 2019-02-27 LAB — CBC
HCT: 24 % — ABNORMAL LOW (ref 39.0–52.0)
Hemoglobin: 8.2 g/dL — ABNORMAL LOW (ref 13.0–17.0)
MCH: 38.5 pg — ABNORMAL HIGH (ref 26.0–34.0)
MCHC: 34.2 g/dL (ref 30.0–36.0)
MCV: 112.7 fL — ABNORMAL HIGH (ref 80.0–100.0)
Platelets: 10 10*3/uL — CL (ref 150–400)
RBC: 2.13 MIL/uL — ABNORMAL LOW (ref 4.22–5.81)
RDW: 16.3 % — ABNORMAL HIGH (ref 11.5–15.5)
WBC: 49.7 10*3/uL — ABNORMAL HIGH (ref 4.0–10.5)
nRBC: 0.2 % (ref 0.0–0.2)

## 2019-02-27 LAB — HIV-1 RNA QUANT-NO REFLEX-BLD
HIV 1 RNA Quant: <20 copies/mL
LOG10 HIV-1 RNA: UNDETERMINED log10copy/mL

## 2019-02-27 LAB — COMPREHENSIVE METABOLIC PANEL
ALT: 13 U/L (ref 0–44)
AST: 51 U/L — ABNORMAL HIGH (ref 15–41)
Albumin: 1.5 g/dL — ABNORMAL LOW (ref 3.5–5.0)
Alkaline Phosphatase: 134 U/L — ABNORMAL HIGH (ref 38–126)
Anion gap: 17 — ABNORMAL HIGH (ref 5–15)
BUN: 82 mg/dL — ABNORMAL HIGH (ref 8–23)
CO2: 16 mmol/L — ABNORMAL LOW (ref 22–32)
Calcium: 7.3 mg/dL — ABNORMAL LOW (ref 8.9–10.3)
Chloride: 100 mmol/L (ref 98–111)
Creatinine, Ser: 4.19 mg/dL — ABNORMAL HIGH (ref 0.61–1.24)
GFR calc Af Amer: 16 mL/min — ABNORMAL LOW (ref >60)
GFR calc non Af Amer: 14 mL/min — ABNORMAL LOW (ref >60)
Glucose, Bld: 241 mg/dL — ABNORMAL HIGH (ref 70–99)
Potassium: 4 mmol/L (ref 3.5–5.1)
Sodium: 133 mmol/L — ABNORMAL LOW (ref 135–145)
Total Bilirubin: 3.2 mg/dL — ABNORMAL HIGH (ref 0.3–1.2)
Total Protein: 6.3 g/dL — ABNORMAL LOW (ref 6.5–8.1)

## 2019-02-27 LAB — GLUCOSE, CAPILLARY
Glucose-Capillary: 189 mg/dL — ABNORMAL HIGH (ref 70–99)
Glucose-Capillary: 216 mg/dL — ABNORMAL HIGH (ref 70–99)
Glucose-Capillary: 261 mg/dL — ABNORMAL HIGH (ref 70–99)
Glucose-Capillary: 284 mg/dL — ABNORMAL HIGH (ref 70–99)

## 2019-02-27 LAB — LACTATE DEHYDROGENASE: LDH: 384 U/L — ABNORMAL HIGH (ref 98–192)

## 2019-02-27 LAB — PROTIME-INR
INR: 2.2 — ABNORMAL HIGH (ref 0.8–1.2)
Prothrombin Time: 24.3 seconds — ABNORMAL HIGH (ref 11.4–15.2)

## 2019-02-27 LAB — PHOSPHORUS: Phosphorus: 5.8 mg/dL — ABNORMAL HIGH (ref 2.5–4.6)

## 2019-02-27 LAB — MAGNESIUM: Magnesium: 1.8 mg/dL (ref 1.7–2.4)

## 2019-02-27 MED ORDER — DEXTROSE 5 % IV SOLN
INTRAVENOUS | Status: DC
  Administered 2019-02-28: 06:00:00 via INTRAVENOUS

## 2019-02-27 MED ORDER — DIPHENHYDRAMINE HCL 50 MG/ML IJ SOLN: 25.0000 mg | INTRAMUSCULAR | Status: DC | PRN

## 2019-02-27 MED ORDER — HYDROMORPHONE HCL 1 MG/ML IJ SOLN: 1.0000 mg | INTRAMUSCULAR | Status: DC | PRN

## 2019-02-27 MED ORDER — GLYCOPYRROLATE 1 MG PO TABS
1.0000 mg | ORAL_TABLET | ORAL | Status: DC | PRN
  Filled 2019-02-27: qty 1

## 2019-02-27 MED ORDER — GLYCOPYRROLATE 0.2 MG/ML IJ SOLN: 0.2000 mg | INTRAMUSCULAR | Status: DC | PRN

## 2019-02-27 MED ORDER — INSULIN ASPART 100 UNIT/ML ~~LOC~~ SOLN
0.0000 [IU] | SUBCUTANEOUS | Status: DC
  Administered 2019-02-27: 5 [IU] via SUBCUTANEOUS

## 2019-02-27 MED ORDER — MORPHINE SULFATE (PF) 2 MG/ML IV SOLN: 2.0000 mg | INTRAVENOUS | Status: DC | PRN

## 2019-02-27 MED ORDER — ACETAMINOPHEN 325 MG PO TABS: 650.0000 mg | ORAL_TABLET | Freq: Four times a day (QID) | ORAL | Status: DC | PRN

## 2019-02-27 MED ORDER — POLYVINYL ALCOHOL 1.4 % OP SOLN
1.0000 [drp] | Freq: Four times a day (QID) | OPHTHALMIC | Status: DC | PRN
  Filled 2019-02-27: qty 15

## 2019-02-27 MED ORDER — ACETAMINOPHEN 650 MG RE SUPP: 650.0000 mg | Freq: Four times a day (QID) | RECTAL | Status: DC | PRN

## 2019-02-27 NOTE — Progress Notes (Signed)
PT Cancellation Note  Patient Details Name: Luke Garcia MRN: [id number] DOB: [birth year]   Cancelled Treatment:    Reason Eval/Treat Not Completed: Medical issues which prohibited therapy;Other (comment)(moving toward comfort care will sign off.).   02/27/2019  Donnella Sham, [place] [phone]  (pager) [phone]  (office)   [name] 02/27/2019, 11:41 AM

## 2019-02-27 NOTE — Progress Notes (Signed)
SLP Cancellation Note  Patient Details Name: Luke Garcia MRN: [id number] DOB: 06/06/1950   Cancelled treatment:       SLP orders discontinued. Pt transitioning to comfort care.  Will complete remaining orders and sign off at this time.   Celedonio Savage, [place], [place] Office: [phone] 02/27/2019, 3:46 PM

## 2019-02-27 NOTE — Progress Notes (Signed)
Daily Progress Note   Patient Name: Luke Garcia       Date: 02/27/2019 DOB: 1951/01/23  Age: 68 y.o. MRN#: [id number] Attending Physician: Josephine Igo, DO Primary Care Physician: Patient, No Pcp Per Admit Date: Mar 21, 2019  Reason for Consultation/Follow-up: Establishing goals of care  Subjective: Patient in bed. Answers simple scripted questions ie- How are you? Fine- but unable to answer any other questions- ie- Do you have pain? (he does not answer). Mr Chancy also appeared to be looking around the room as if having visual hallucinations but did not answer when asked what he was seeing.  Noted discussion attending provider had with patient's son today re: patient with poor prognosis- plan to continue antibiotics, but discontinue other life prolonging measures. Hopeful for patient son to come from [place] and then transition to full comfort. Patient has been made DNR and some comfort orders placed. He continues on IV fluids and artificial feeding.   Review of Systems  Unable to perform ROS: Mental status change    Length of Stay: 4  Current Medications: Scheduled Meds:  . atorvastatin  80 mg Oral q1800  . Chlorhexidine Gluconate Cloth  6 each Topical Daily    Continuous Infusions: . sodium chloride Stopped (02/25/19 2238)  .  ceFAZolin (ANCEF) IV Stopped (02/27/19 1015)  . dextrose    . famotidine (PEPCID) IV Stopped (02/27/19 1107)  . feeding supplement (VITAL 1.5 CAL) 1,000 mL (02/27/19 1043)    PRN Meds: acetaminophen **OR** acetaminophen, albuterol, diphenhydrAMINE, glycopyrrolate **OR** glycopyrrolate **OR** glycopyrrolate, HYDROmorphone (DILAUDID) injection, LORazepam, ondansetron **OR** ondansetron (ZOFRAN) IV, polyethylene glycol, polyvinyl alcohol  Physical Exam  Vitals signs and nursing note reviewed.  Cardiovascular:     Rate and Rhythm: Normal rate.  Pulmonary:     Effort: Pulmonary effort is normal.  Skin:    Coloration: Skin is jaundiced.  Neurological:     Mental Status: He is alert. He is disoriented.  Psychiatric:        Mood and Affect: Affect is flat.        Cognition and Memory: Cognition is impaired. Memory is impaired.        Judgment: Judgment is inappropriate.             Vital Signs: BP 111/76 (BP Location: Left Arm)   Pulse 92  Temp 97.6 F (36.4 C) (Axillary)   Resp 20   Ht 6\' 1"  (1.854 m)   Wt 99.5 kg   SpO2 98%   BMI 28.94 kg/m  SpO2: SpO2: 98 % O2 Device: O2 Device: Room Air O2 Flow Rate:    Intake/output summary:   Intake/Output Summary (Last 24 hours) at 02/27/2019 1530 Last data filed at 02/27/2019 1300 Gross per 24 hour  Intake 1602.95 ml  Output 225 ml  Net 1377.95 ml   LBM: Last BM Date: 02/26/19 Baseline Weight: Weight: 92.1 kg Most recent weight: Weight: 99.5 kg       Palliative Assessment/Data: PPS: 10%      Patient Active Problem List   Diagnosis Date Noted  . Advanced care planning/counseling discussion   . Goals of care, counseling/discussion   . Palliative care by specialist   . Staphylococcus epidermidis bacteremia 02/25/2019  . Alcoholic cirrhosis of liver without ascites ([place]) 02/25/2019  . Thrombocytopenia ([place]) 02/25/2019  . DIC (disseminated intravascular coagulation) ([place]) 02/25/2019  . Dysphagia following cerebral infarction 02/25/2019  . Metabolic acidosis 34/74/2595  . Acute encephalopathy 02/25/2019  . Sepsis ([place]) 02/25/2019  . 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 Acute lower UTI 02/23/2019  . CVA (cerebral vascular accident)/Acute Cerebella Stroke 02/23/2019  . Endocarditis 02/23/2019  . AKI (acute kidney injury) ([place]) 02/23/2019  . New onset a-fib ([place]) 03/10/2019  . HTN (hypertension) 02/16/2019  . Weakness generalized  03/04/2019  . Leukocytosis 02/21/2019  . Dyslipidemia 03/12/2012  . Chest pain, Troponin negative X 3 03/11/2012  . DM (diabetes mellitus), type 2, uncontrolled with complications ([place]) 63/87/5643  . Uncontrolled hypertension 03/11/2012  . A-fib, unknown duration 03/11/2012  . Alcohol abuse, daily use 03/11/2012    Palliative Care Assessment & Plan   Patient Profile: 69 y.o. male  with past medical history of DM2- not controlled, HTN admitted on 03/15/2019 with shortness of breath, hyperglycemia, AKI on CKD, A fib with RVR. Further workup during admission reveals ischemic cerebellar infarct with residual L extemity weakness, dysarthria and dysphagia (barrium swallow showing silent aspiration- pt now NPO with cortrak in place.); CHF with sepsis bacteremia r/t vegetation on tricuspid valve, hepatic cirrhosis (MELD 31 indicating 52.6% 3 month mortality); severe thrombocytopenia ?hepatic origin, ?infectious, ?HITT. He initially required levophed but has weaned off. CT surgery consulted and recommended TEE with possible catheter debridement of vegetation if vegetation is primarily atrial. TEE is scheduled for Monday 11/16. Cr is not trending up. Given multiple comorbidities patient likely has poor prognosis. Palliative consulted for [place].    Assessment/Recommendations/Plan   Patient now with [place] for limited interventions- continuing IV abx, fluids, and artificial feeding until family can arrive from [place]  Symptom management- changed prn morphine to hydromorphone due to pt GFR <30.   Attempted to call patient's son to discuss possibly stopping artificial feeding and pulling NG tube as patient was very uncomfortable with it, and at this point will not make much difference in his overall trajectory- no answer- left message requesting return call  Goals of Care and Additional Recommendations:  Limitations on Scope of Treatment: No Diagnostics  Code Status:  DNR  Prognosis:   < 2 weeks   Discharge Planning:  To Be Determined  Care plan was discussed with Dr. Valeta Harms.   Thank you for allowing the Palliative Medicine Team to assist in the care of this patient.   Time In: 1500 Time Out: 1535 Total Time 35 mins Prolonged Time Billed  no      Greater than 50%  of this time was spent counseling and coordinating care related to the above assessment and plan.  Mariana Kaufman, AGNP-C Palliative Medicine   Please contact Palliative Medicine Team phone at [phone] for questions and concerns.

## 2019-02-27 NOTE — Progress Notes (Signed)
Patient pulled his Cortrak, feeding held at this time.

## 2019-02-27 NOTE — Progress Notes (Addendum)
NAME:  Luke Garcia, MRN:  [id number], DOB:  [birth year], LOS: 4 ADMISSION DATE:  02/20/2019, CONSULTATION DATE:  02/25/19 REFERRING MD:  [facility]   CHIEF COMPLAINT:  weakness   Brief History   Luke Garcia is a 68 y.o. male who was admitted to [facility] 11/9 with AFRVR, hyperglycemia, AKI.  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 Transferred to [facility] for CT Surgery Evaluation   History of present illness    Luke Garcia is a 68 y.o. male who has a PMH including but not limited to HTN, DM, A.fib, EtOH abuse.  He presented to [facility] ED 11/9 with dyspnea and generalized weakness.  He apparently lives in a camper behind a friends house.  In ED he was found to have AFRVR with HR in 150s, CBG > 500, AKI.  He was admitted by [facility] and started on cardizem gtt.  Anticoagulation was held due to significant thrombocytopenia.  After admission, echo revealed LVEF 60-65%, severe LVH, normal RV, severe biatrial enlargement, large tricuspid valve vegetation.  Infectious workup revealed staph epidermidis (oxa S) in 4/4 bottles.  He was started on vanc and ceftriaxone.  ID was consulted and abx were switched to single therapy cefazolin.  CT head was negative but brain MRI revealed acute right cerebellar infarct.    On 11/12, he became hypotensive and was subsequently started on levophed.  He was later transferred to [facility] for further evaluation and management including TCTS consult given TV vegetation.  Past Medical History  11/9 > admit. 11/12 > transfer to [facility].  Significant Hospital Events   HTN, DM, A.fib, EtOH abuse.  Consults:  Cardiology, Neurology, ID, PCCM.  Procedures:  None.  Significant Diagnostic Tests:  CT Head 11/9 > no acute process. MRI brain 11/10 > small right cerebellar infarct. Abd [place] 11/10 > cirrhosis, small volume ascites. Echo 11/10 > EF 60-65%, severely dilated LA and RA, TV vegetation. EEG 11/12 > normal. CT chest 11/13 >> right lower lobe patchy infiltrate with associated small  effusion, 5 mm subsolid right basilar nodule.  Question poorly formed left lower lobe nodule  Micro Data:  Blood 11/9 > Staph epidermidis. SARS CoV2 11/9 > neg.  Antimicrobials:  Ceftriaxone 11/9 > 11/12. Vanc 11/9 > 11/10. Cefazolin 11/12 >   Interim history/subjective:   I called and spoke with Damaris Schooner, patient's son via phone this morning.  He is well aware of all of the medical conditions that his father has.  He currently lives in [place].  He has a good understanding that his multiple medical problems are likely going to take his life.  He does not want any heroic measures.  We will not be placing his father on mechanical life support or doing dialysis.  We talked about his decompensating acute renal failure in the setting of advanced liver disease.  We also talked about the infection in his heart.  He is unlikely to survive his multiple medical comorbidities without significant intervention and even additional intervention is likely going to lead to the patient's demise.  We talked about this in detail.  The decision was made for him to be a DNR and that we would do everything we could to keep him alive while the patient's family is coming but we will not be instituting life-sustaining measures such as mechanical life support, vasopressors, CPR or initiation of dialysis.  Therefore the withdrawal of life-sustaining treatment order set and comfort measures will be instituted.  We can continue antibiotics  at this time.  Objective:  Blood pressure 117/67, pulse (!) 125, temperature 99 F (37.2 C), temperature source Oral, resp. rate 16, height 6\' 1"  (1.854 m), weight 99.5 kg, SpO2 100 %.        Intake/Output Summary (Last 24 hours) at 02/27/2019 0839 Last data filed at 02/27/2019 0400 Gross per 24 hour  Intake 1719.13 ml  Output 250 ml  Net 1469.13 ml   Filed Weights   02/25/19 1805 02/25/19 2200 02/27/19 0500  Weight: 95.6 kg 98.1 kg 99.5 kg    Examination: General:  Chronically ill-appearing debilitated male lying in bed no distress Neuro: Follows with eyes, tracks, moves limbs spontaneously HEENT: Mouth dry Cardiovascular: Irregular, S1-S2 Lungs: Diminished breath sounds bilaterally no wheeze Abdomen: Mildly distended Musculoskeletal: Anasarca, skin tears Skin: Multiple petechia skin tears anasarca   Assessment & Plan:   Goals of care Please see documented conversation above with patient's son Damaris SchoonerJames Martel.  This conversation was confirmed DNR status and no heroic measures by patient's bedside nurse.  We will not have any plans for escalation of care.  Okay to continue antibiotics at this time.  Patient son trying to make arrangements to come from [place].  He understands that the multiple medical comorbidities are unlikely survivable.  And that we will not be placing the patient on mechanical life support or initiating dialysis. As needed comfort medications have been prescribed. Patient will be transferred from the intensive care unit to the palliative care floor.  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 Right lower lobe pneumonia, small bilateral LL pulmonary nodules, presumed embolic Small right pleural effusion  A.fib with RVR.  Now with adequate rate control Plan Unable to anticoagulate due to thrombocytopenia  Acute Kidney Injury  Lactic Acidosis  Plan  Worsening renal failure today in the setting of advanced liver disease  Encephalopathy in setting of sepsis and uremia Right cerebellar CVA, new. Plan Continue Keppra  Dysphagia. Plan N.p.o.  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 EtOH abuse. Plan Thiamine folate  Severe thrombocytopenia - multifactorial in setting of sepsis and cirrhosis.   -Peripheral smear neg for schistocytes Initial question HIV - screen pos but Ab neg. Plan. -HIV viral load confirms negative -Follow CBC, no schistocytes as above -Suspicion for HITT low based on clinical scenario  -Observe clinically   DM. Plan SSI plus CBG   Best Practice:  Diet: NPO DVT prophylaxis: SCD's GI prophylaxis: Pepcid  Glucose control: SSI Mobility: Bedrest Code Status: FC Family Communication: Updated by Dr. Rande LawmanErwin 11/12 Disposition: ICU  Labs   CBC: Recent Labs  Lab 02/23/19 0425 02/24/19 0407 02/25/19 0530 02/25/19 1119 02/26/19 0330 02/27/19 0425  WBC 28.4* 23.8* 29.5*  --  31.2* 49.7*  NEUTROABS  --   --  24.3*  --  25.6*  --   HGB 7.6* 8.9* 8.7*  --  8.1* 8.2*  HCT 23.6* 28.6* 27.4*  --  24.5* 24.0*  MCV 113.5* 117.2* 115.6*  --  112.9* 112.7*  PLT 34* 21* 9* 14* 26* 10*   Basic Metabolic Panel: Recent Labs  Lab 02/21/2019 1729  02/24/19 0407 02/25/19 0530 02/25/19 2222 02/26/19 0330 02/26/19 1643 02/27/19 0425  NA  --    < > 129* 130* 132* 133*  --  133*  K  --    < > 3.8 4.7 4.4 3.8  --  4.0  CL  --    < > 99 100 100 102  --  100  CO2  --    < >  18* 16* 17* 17*  --  16*  GLUCOSE  --    < > 181* 233* 217* 194*  --  241*  BUN  --    < > 59* 74* 75* 75*  --  82*  CREATININE  --    < > 2.39* 3.15* 3.60* 3.74*  --  4.19*  CALCIUM  --    < > 7.4* 7.4* 7.1* 6.9*  --  7.3*  MG 1.2*  --   --   --  1.3* 1.6* 1.9 1.8  PHOS  --   --   --   --   --  6.0* 5.6* 5.8*   < > = values in this interval not displayed.   GFR: Estimated Creatinine Clearance: 21.2 mL/min (A) (by C-G formula based on SCr of 4.19 mg/dL (H)). Recent Labs  Lab 02/24/19 0407 02/25/19 0530 02/25/19 1718 02/25/19 1952 02/25/19 2222 02/26/19 0330 02/27/19 0425  WBC 23.8* 29.5*  --   --   --  31.2* 49.7*  LATICACIDVEN  --   --  2.7* 3.2* 3.2* 4.5*  --    Liver Function Tests: Recent Labs  Lab 02/28/2019 1148 02/24/19 0407 02/26/19 0330 02/27/19 0425  AST 35 31 44* 51*  ALT 19 17 19 13   ALKPHOS 137* 123 117 134*  BILITOT 4.6* 4.0* 2.6* 3.2*  PROT 7.3 6.8 6.5 6.3*  ALBUMIN 1.9* 1.8* 1.5* 1.5*   No results for input(s): LIPASE, AMYLASE in the last 168 hours. Recent Labs  Lab 02/24/19 0407  AMMONIA  25   ABG No results found for: PHART, PCO2ART, PO2ART, HCO3, TCO2, ACIDBASEDEF, O2SAT  Coagulation Profile: Recent Labs  Lab 02/23/19 1142 02/25/19 0948 02/25/19 1119 02/27/19 0425  INR 1.8* 2.0* 1.9* 2.2*   Cardiac Enzymes: No results for input(s): CKTOTAL, CKMB, CKMBINDEX, TROPONINI in the last 168 hours. HbA1C: Hgb A1c MFr Bld  Date/Time Value Ref Range Status  03/14/2019 05:27 PM 6.8 (H) 4.8 - 5.6 % Final    Comment:    (NOTE) Pre diabetes:          5.7%-6.4% Diabetes:              >6.4% Glycemic control for   <7.0% adults with diabetes   03/11/2012 10:03 AM 9.3 (H) <5.7 % Final    Comment:    (NOTE)                                                                       According to the ADA Clinical Practice Recommendations for 2011, when HbA1c is used as a screening test:  >=6.5%   Diagnostic of Diabetes Mellitus           (if abnormal result is confirmed) 5.7-6.4%   Increased risk of developing Diabetes Mellitus References:Diagnosis and Classification of Diabetes Mellitus,Diabetes XIPJ,8250,53(ZJQBH 1):S62-S69 and Standards of Medical Care in         Diabetes - 2011,Diabetes Care,2011,34 (Suppl 1):S11-S61.   CBG: Recent Labs  Lab 02/26/19 1149 02/26/19 1615 02/26/19 1953 02/27/19 0029 02/27/19 0444  GLUCAP 188* 131* 146* 189* 216*     21 minutes spent with advanced care planning discussion with patient's son via phone.   Garner Nash, DO [facility]  02/27/2019 8:39 AM

## 2019-02-28 DIAGNOSIS — Z515 Encounter for palliative care: Secondary | ICD-10-CM

## 2019-02-28 NOTE — Progress Notes (Signed)
[name] is a 68 year old man with multiple medical problems including hypertension, DM, A. fib, EtOH, who was admitted to [facility] on 11/9 with A. fib with RVR.  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  Hospitalization has been complicated by septic shock.  He was transitioned to full comfort care by family.  I met with patient's son this morning.  Son confirms desire to ensure patient is comfortable.  Noncomfort meds were appropriately discontinued by CCM.  Discussed disposition with son.  He would be interested in patient transferring to residential hospice facility for comfort care.  Consulted SW to help with disposition. I called and spoke with the hospice liaison who will meet with family.  Unfortunately, there are not currently any beds available at [facility]. Patient was placed on the waiting list.   Discussed with Francine Graven, NP.  ROS: Patient unable to complete  Physical Exam  Constitutional:  Ill-appearing  Cardiovascular:  Irregular  Respiratory: Effort normal.  Unlabored  Neurological:  Wakes when stimulated, answers questions with 1 or 2 words, confused  Skin: Skin is warm.   Plan: -Continue comfort care -Social work consult to help with disposition -Family interested in residential hospice  Time Total: 20 minutes  Visit consisted of counseling and education dealing with the complex and emotionally intense issues of symptom management and palliative care in the setting of serious and potentially life-threatening illness.Greater than 50%  of this time was spent counseling and coordinating care related to the above assessment and plan.  Signed by: Altha Harm, PhD, NP-C

## 2019-02-28 NOTE — Progress Notes (Signed)
Nurse, mental health  Received referral from Billey Chang, NP for [employer]. Writer called pt's son, Bronsyn to confirm that comfort is goal of care and pt and family wish to have pt transferred to [facility] for EOL care. Informed that there are not currently any beds open and that [facility] will inform both family and [place] Day manager when a bed is open.   Freddie Breech, RN [facility] Liaison [phone]

## 2019-02-28 NOTE — Progress Notes (Addendum)
NAME:  Luke Garcia, MRN:  [id number], DOB:  August 11, 1950, LOS: 5 ADMISSION DATE:  03/10/2019, CONSULTATION DATE:  02/25/19 REFERRING MD:  [facility]   CHIEF COMPLAINT:  weakness   Brief History   Luke Garcia is a 68 y.o. male who was admitted to [facility] 11/9 with AFRVR, hyperglycemia, AKI.  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 Transferred to [facility] for CT Surgery Evaluation   History of present illness    Luke Garcia is a 68 y.o. male who has a PMH including but not limited to HTN, DM, A.fib, EtOH abuse.  He presented to [facility] ED 11/9 with dyspnea and generalized weakness.  He apparently lives in a camper behind a friends house.  In ED he was found to have AFRVR with HR in 150s, CBG > 500, AKI.  He was admitted by [facility] and started on cardizem gtt.  Anticoagulation was held due to significant thrombocytopenia.  After admission, echo revealed LVEF 60-65%, severe LVH, normal RV, severe biatrial enlargement, large tricuspid valve vegetation.  Infectious workup revealed staph epidermidis (oxa S) in 4/4 bottles.  He was started on vanc and ceftriaxone.  ID was consulted and abx were switched to single therapy cefazolin.  CT head was negative but brain MRI revealed acute right cerebellar infarct.    On 11/12, he became hypotensive and was subsequently started on levophed.  He was later transferred to [facility] for further evaluation and management including TCTS consult given TV vegetation.  Past Medical History  HTN, DM, A.fib, EtOH abuse.  Significant Hospital Events   11/9 > admit. 11/12 > transfer to [facility]. 11/14 transition to comfort care  Consults:  Cardiology, Neurology, ID, PCCM.  Procedures:  None.  Significant Diagnostic Tests:  CT Head 11/9 > no acute process. MRI brain 11/10 > small right cerebellar infarct. Abd [place] 11/10 > cirrhosis, small volume ascites. Echo 11/10 > EF 60-65%, severely dilated LA and RA, TV vegetation. EEG 11/12 > normal. CT chest 11/13 >> right lower lobe patchy  infiltrate with associated small effusion, 5 mm subsolid right basilar nodule.  Question poorly formed left lower lobe nodule  Micro Data:  Blood 11/9 > Staph epidermidis. SARS CoV2 11/9 > neg.  Antimicrobials:  Ceftriaxone 11/9 > 11/12. Vanc 11/9 > 11/10. Cefazolin 11/12 >   Interim history/subjective:   Objective:  Blood pressure 130/77, pulse (!) 108, temperature (!) 97.5 F (36.4 C), temperature source Axillary, resp. rate 18, height 6\' 1"  (1.854 m), weight 99.5 kg, SpO2 99 %.        Intake/Output Summary (Last 24 hours) at 02/28/2019 1038 Last data filed at 02/28/2019 0900 Gross per 24 hour  Intake 502.21 ml  Output 500 ml  Net 2.21 ml   Filed Weights   02/25/19 1805 02/25/19 2200 02/27/19 0500  Weight: 95.6 kg 98.1 kg 99.5 kg    Examination: General: Chronically ill-appearing debilitated male lying in bed no distress Neuro: alert to person, follows commands, denies pain  HEENT: Icteric Cardiovascular: Irregular, S1-S2 Lungs: Diminished breath sounds bilaterally, no wheezing or rhonchi Abdomen: Rounded, slightly distended Musculoskeletal: Anasarca, skin tears Skin: Multiple petechia,  skin tears anasarca   Assessment & Plan:  This is a 4 yp male with: 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 Right lower lobe pneumonia, small bilateral LL pulmonary nodules, presumed embolic Small right pleural effusion A.fib with RVR.  Now with adequate rate control Acute Kidney Injury  Lactic Acidosis  Encephalopathy in setting of sepsis and  uremia Right cerebellar CVA, new. Dysphagia. 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 EtOH abuse. Severe thrombocytopenia - multifactorial in setting of sepsis and cirrhosis.   Initial question HIV - screen pos but Ab neg.viral load confirms negative DM  Transitioned to limited interventions 11/14. -continuing IV abx, fluids, and artificial feeding until family can arrive from [place] -Symptom management- appreciate palliative care  managing  -he may be appropriate for home hospice       Best Practice:  Diet: NPO DVT prophylaxis: Comfort  GI prophylaxis: Pepcid  Glucose control: not indicated Mobility: Bedrest Code Status: DNR/comfort care  Family Communication: Updated by Dr. Valeta Harms 11/14 Disposition: medical floor. Possibly home with hospice.   Francine Graven, MSN, AGACNP  [facility]

## 2019-02-28 NOTE — Progress Notes (Signed)
Pt refuses any pain med offered. Pt denies pain all night.

## 2019-02-28 NOTE — Progress Notes (Signed)
Pt son Johnmatthew Solorio, Brooke Bonito is here tel [phone] wants to know the update, he's ok for palliative care.

## 2019-02-28 NOTE — Progress Notes (Addendum)
Spoke with son Phinehas Grounds at bedside. He is agreeable to pursue full transition to comfort care focus. Will D/c abx, TF, famotidine. Pt may be appropriate for Hospice. Left message with Palliative care.

## 2019-03-01 ENCOUNTER — Ambulatory Visit (HOSPITAL_COMMUNITY): Admit: 2019-03-01 | Payer: Self-pay | Admitting: Cardiovascular Disease

## 2019-03-01 LAB — CULTURE, BLOOD (ROUTINE X 2)
Culture: NO GROWTH
Culture: NO GROWTH
Special Requests: ADEQUATE

## 2019-03-01 SURGERY — ECHOCARDIOGRAM, TRANSESOPHAGEAL
Anesthesia: Monitor Anesthesia Care

## 2019-03-02 LAB — CULTURE, BLOOD (ROUTINE X 2)
Culture: NO GROWTH
Special Requests: ADEQUATE

## 2019-03-03 ENCOUNTER — Telehealth: Payer: Self-pay

## 2019-03-03 NOTE — Telephone Encounter (Signed)
Received dc from [facility].  DC is for cremation and a patient of Doctor Tamala Julian.  DC will be taken to Pulmonary Unit for signature.  On 03/05/2019 Received dc back from Doctor Halford Chessman, I called the funeral home to let them know the dc is ready for pickup and also faxed a copy to the funeral home per their request.

## 2019-03-08 LAB — CULTURE, BLOOD (ROUTINE X 2)
Culture: NO GROWTH
Special Requests: ADEQUATE

## 2019-03-16 NOTE — Progress Notes (Signed)
Pt with shallow breathing at this time. Pt not responding anymore. Notified son via voice message.

## 2019-03-16 NOTE — Discharge Summary (Signed)
Date of Admission: 02/23/19 Date of Death: 2019-03-30 Diagnoses: Multiorgan failure due to staph epidermidis endocarditis  Hospital Course: Luke Garcia is a 68 y.o. male who has a PMH including but not limited to HTN, DM, A.fib, EtOH cirrhosis.  He presented to [facility] ED 11/9 with dyspnea and generalized weakness.  He apparently lives in a camper behind a friends house.  In ED he was found to have AFRVR with HR in 150s, CBG > 500, AKI.  He was admitted by [facility] and started on cardizem gtt.  Anticoagulation was held due to significant thrombocytopenia.  After admission, echo revealed LVEF 60-65%, severe LVH, normal RV, severe biatrial enlargement, large tricuspid valve vegetation.  Infectious workup revealed staph epidermidis (oxa S) in 4/4 bottles.  He was started on vanc and ceftriaxone.  ID was consulted and abx were switched to single therapy cefazolin.  CT head was negative but brain MRI revealed acute right cerebellar infarct.    On 11/12, he became hypotensive and was subsequently started on levophed.  He was later transferred to [facility] for further evaluation and management including TCTS consult given TV vegetation.  Thoracic surgery did not think surgery would be beneficial.  Pilar Plate conversations were had with patient and son regarding prognosis and interventions.given worsening multiorgan failure.  Patient elected to transition to comfort care and passed away.    Erskine Emery MD PCCM

## 2019-03-16 DEATH — deceased

## 2020-05-10 IMAGING — CT CT CHEST W/O CM
2 of 4 series · 15 of 36 positions shown, 18 images · non-contrast
Comparison: None.

CLINICAL DATA: Acute respiratory illness difficulty breathing

EXAM:
CT CHEST WITHOUT CONTRAST
TECHNIQUE: Multidetector CT imaging of the chest was performed following the
standard protocol without IV contrast.

[Series 4: thorax 2.0 · axial · 0.98mm/px · z∈[+1377,+1619]mm · 12 of 137 slices shown, 15 images]
[im 8/137  mediastinal]
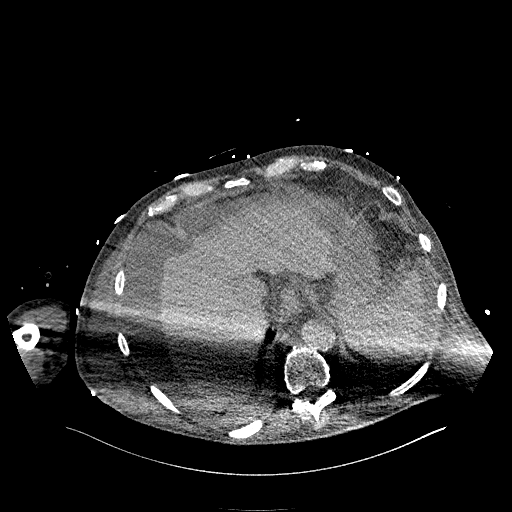
[im 8/137  lung]
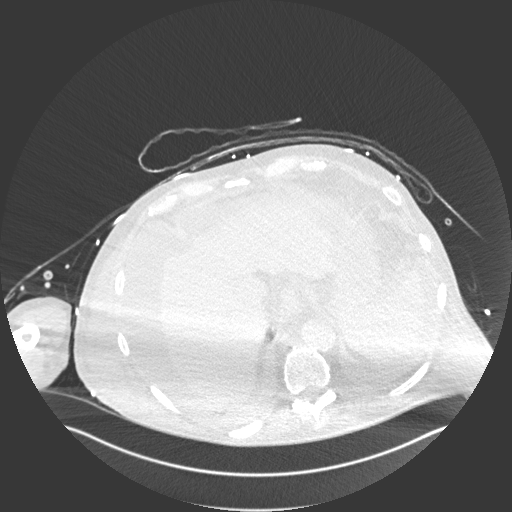
[im 22/137  lung]
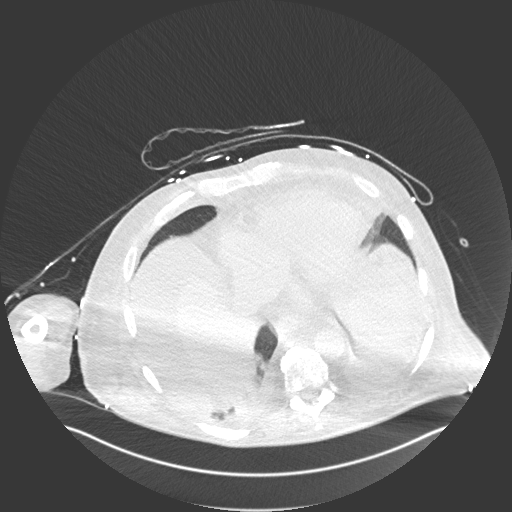
[im 29/137  lung]
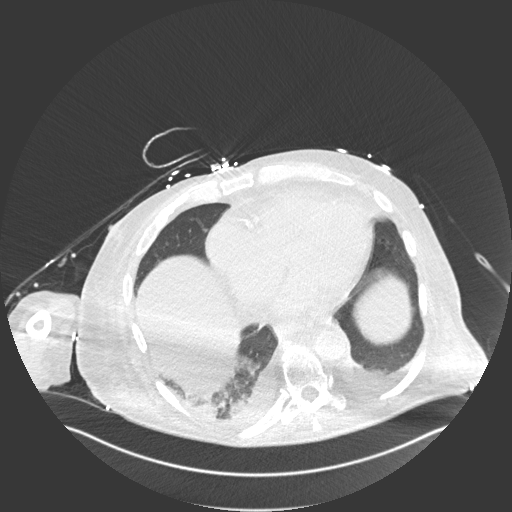
[im 43/137  lung]
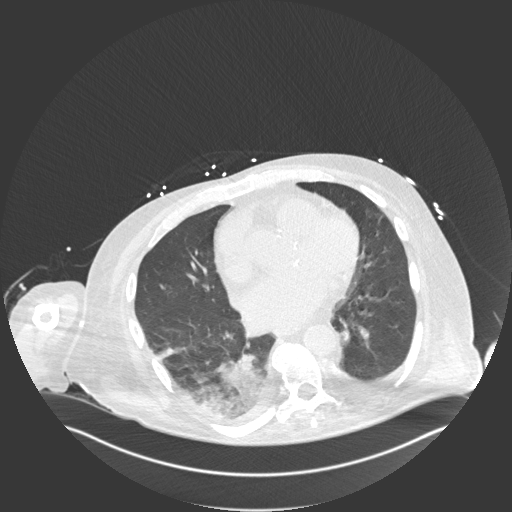
[im 51/137  mediastinal]
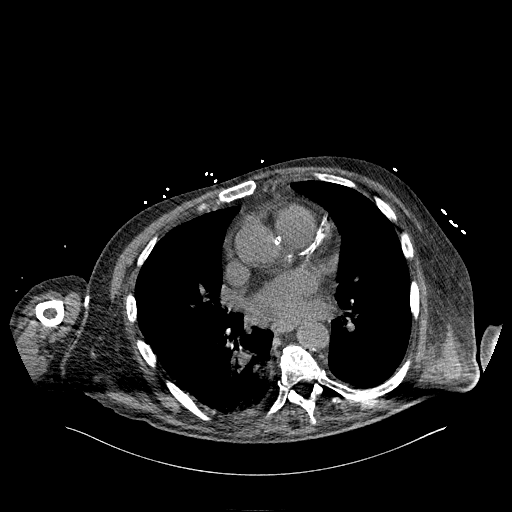
[im 51/137  lung]
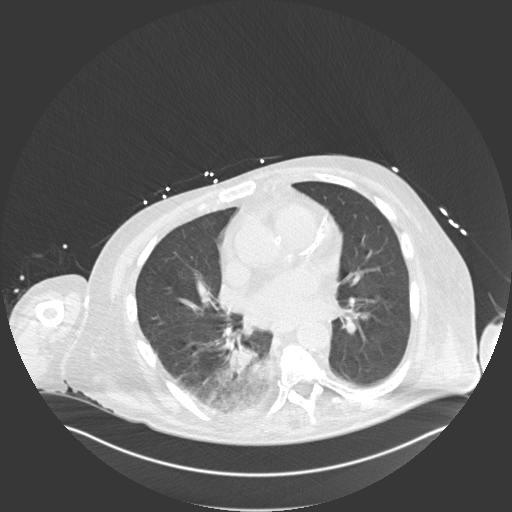
[im 65/137  lung]
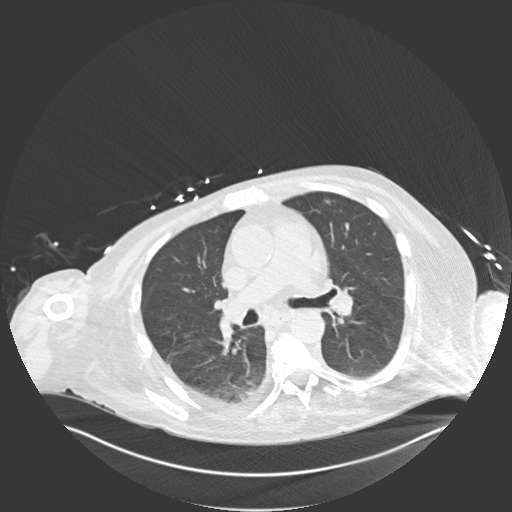
[im 72/137  lung]
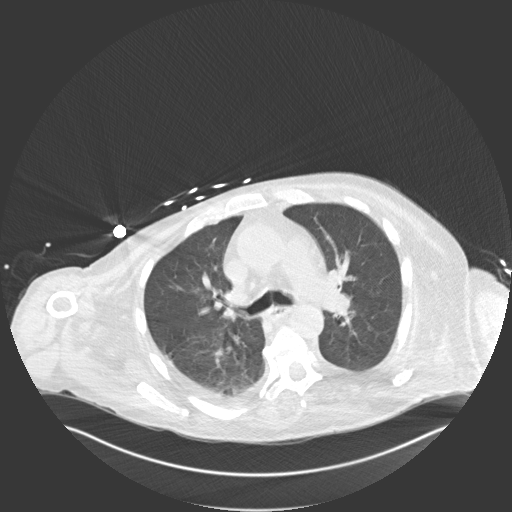
[im 86/137  lung]
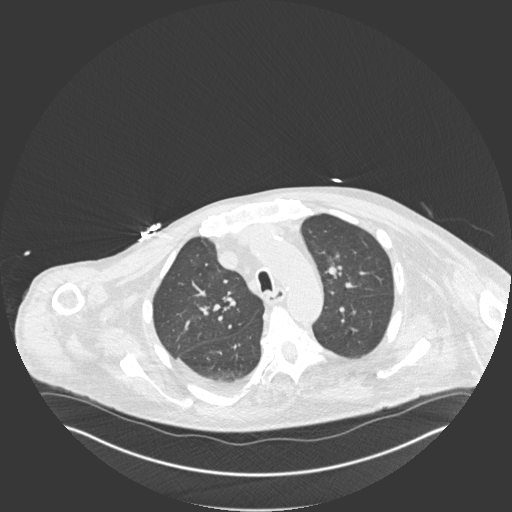
[im 94/137  mediastinal]
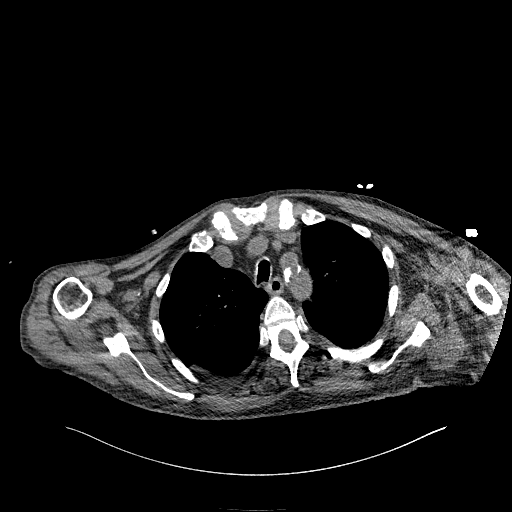
[im 94/137  lung]
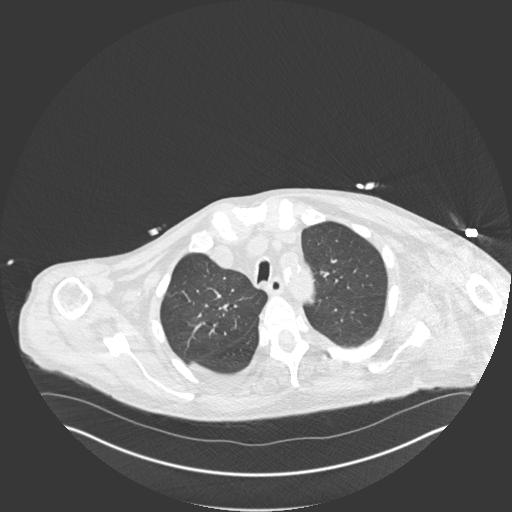
[im 108/137  lung]
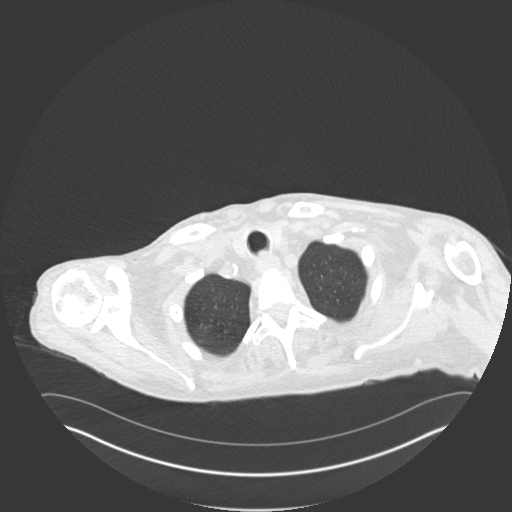
[im 115/137  lung]
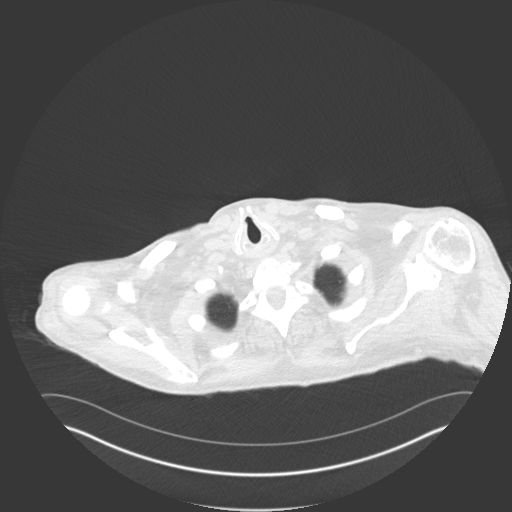
[im 129/137  lung]
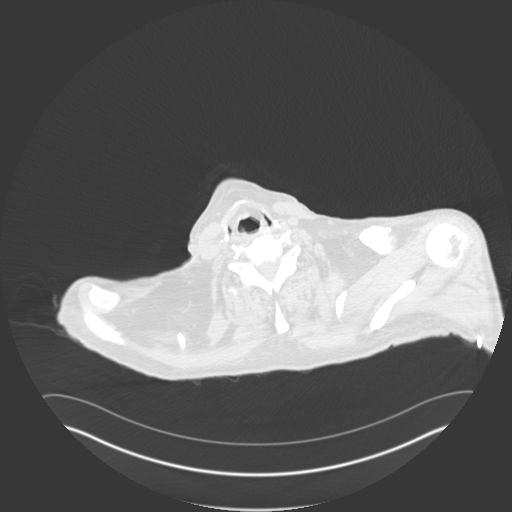

[Series 7: coronal · coronal · 0.56mm/px · 3 of 87 slices shown]
[im 18/87  lung]
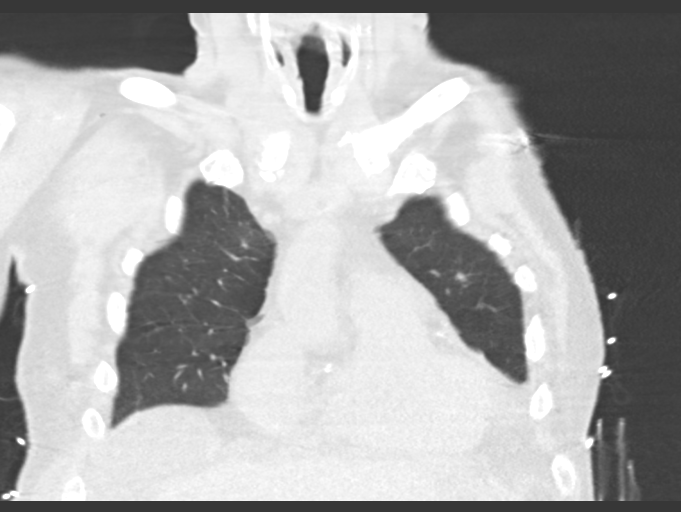
[im 35/87  lung]
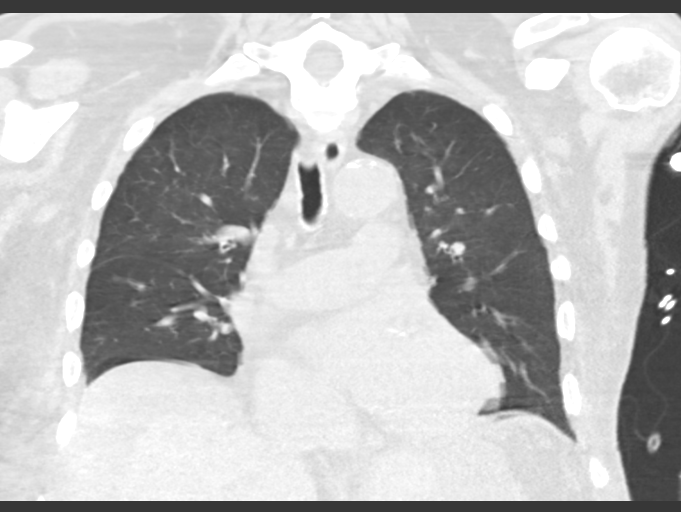
[im 52/87  lung]
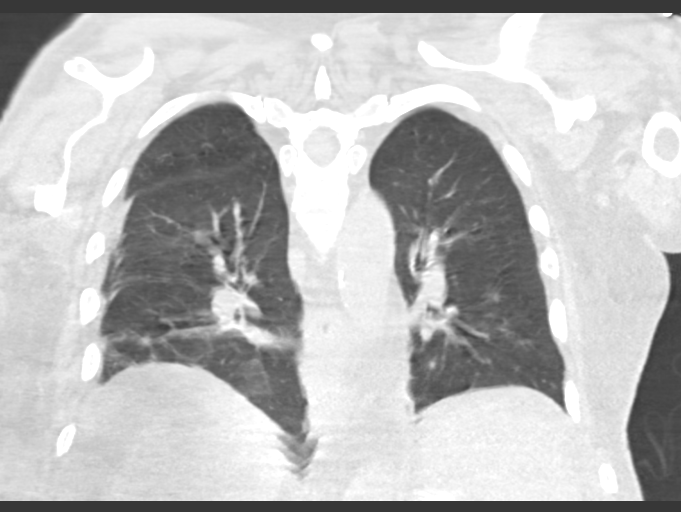

[15 of 36 positions shown; findings below may reference images not displayed]

FINDINGS: Cardiovascular: Thoracic aorta and its branches demonstrate
atherosclerotic calcification. No aneurysmal dilatation is seen.
Coronary calcifications are seen. The heart is at the upper limits
of normal size. No pericardial effusion is noted.

Mediastinum/Nodes: Thoracic inlet is within normal limits. The
esophagus as visualized is unremarkable. No hilar or mediastinal
adenopathy is noted.

Lungs/Pleura: Lungs are well aerated bilaterally. Right lower lobe
patchy infiltrate is noted with associated mild effusion. A small
left pleural effusion is seen. 5 mm sub solid right upper lobe
nodule is noted. No other focal infiltrate is seen.

Upper Abdomen: Visualized upper abdomen demonstrates mild ascites
surrounding the liver and spleen. No other focal abnormality is
noted.

Musculoskeletal: Degenerative changes of the thoracic spine are
noted. No compression deformity is noted.
IMPRESSION: Bilateral pleural effusions right greater than left with associated
patchy right lower lobe infiltrate consistent with acute pneumonia.

5 mm sub solid right upper lobe nodular. No follow-up needed if
patient is low-risk. Non-contrast chest CT can be considered in 12
months if patient is high-risk. This recommendation follows the
consensus statement: Guidelines for Management of Incidental
Pulmonary Nodules Detected on CT Images: From the [HOSPITAL]

Ascites in the abdomen consistent with the known history of
underlying cirrhosis.

Aortic Atherosclerosis ([id number]).

## 2020-05-12 IMAGING — DX DG CHEST 1V PORT
1 series · 1 of 1 positions shown · non-contrast
Comparison: 02/22/2019

CLINICAL DATA: Evaluate for pneumonia.

EXAM:
PORTABLE CHEST 1 VIEW

[chest]
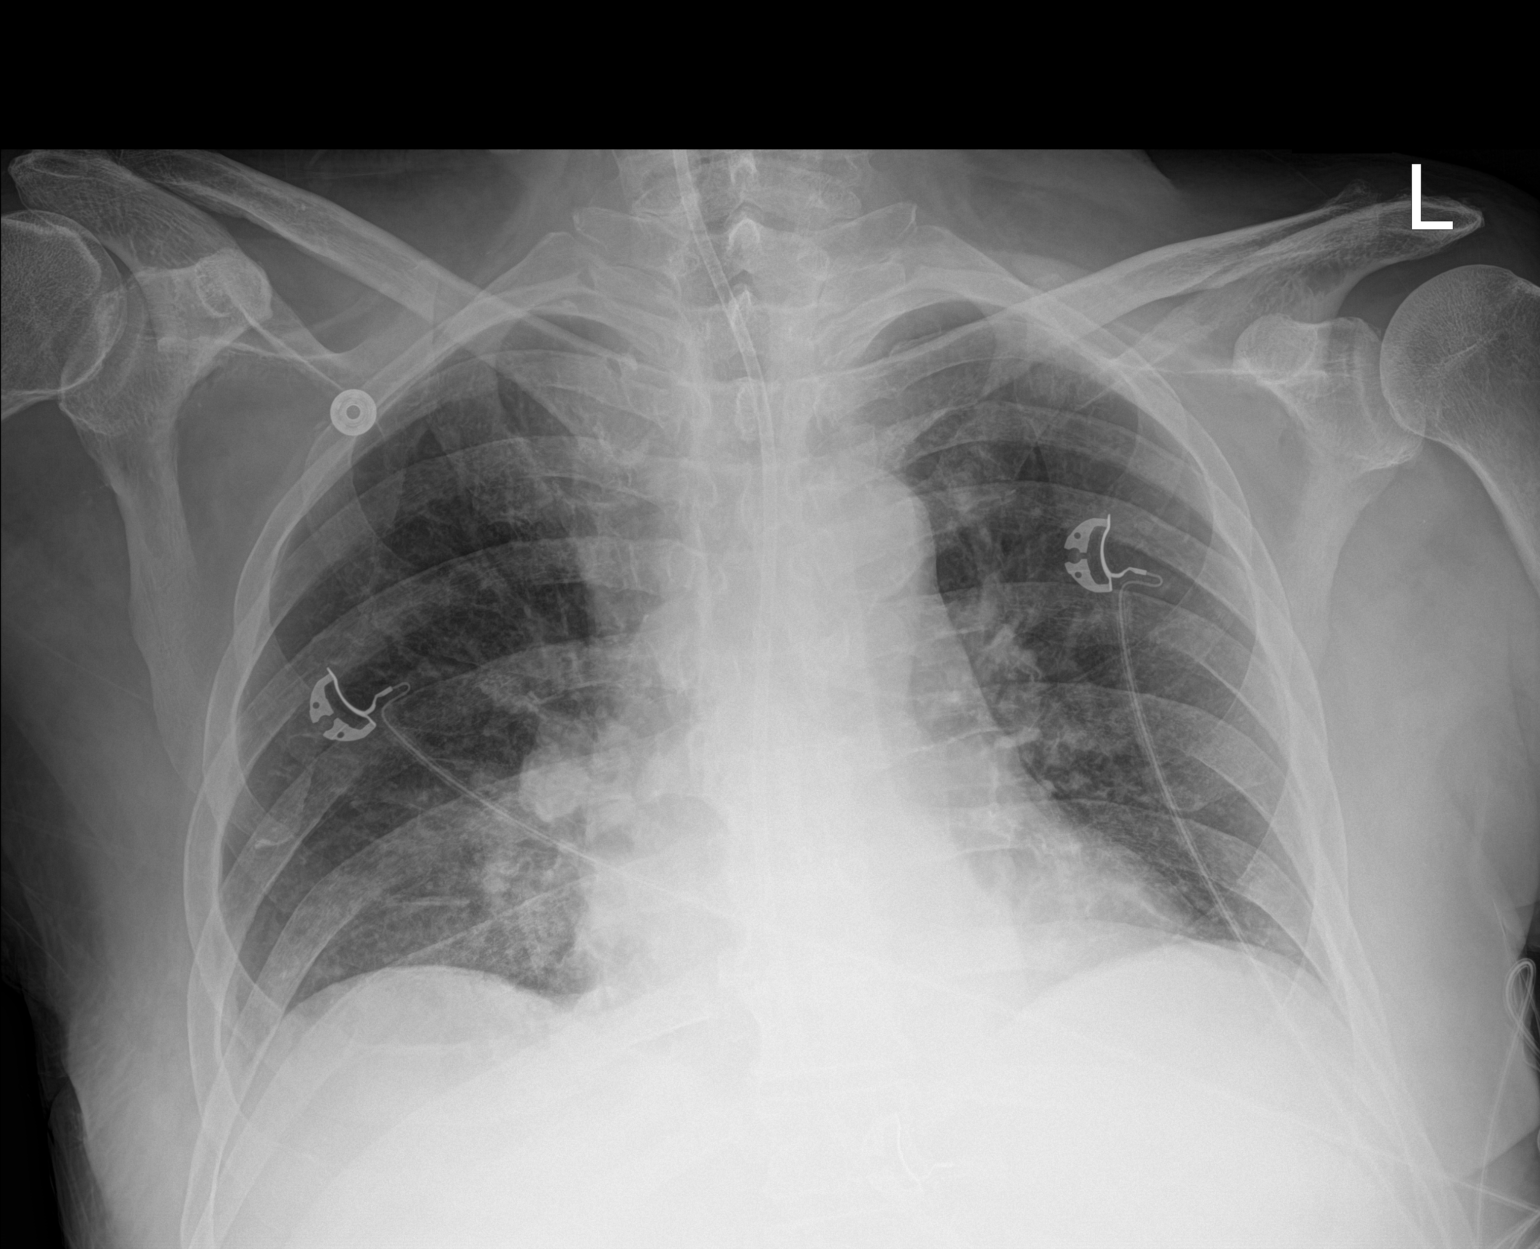

[1 of 1 positions shown; findings below may reference images not displayed]

FINDINGS: Feeding tube is in place. Tip is below the level of the GE junction.
Normal heart size. Lung volumes are low. No airspace opacities. No
pleural effusion or edema noted.
IMPRESSION: 1. Decreased lung volumes.
2. No acute cardiopulmonary abnormality noted.
